# Patient Record
Sex: Female | Born: 1950 | Race: Black or African American | Hispanic: No | Smoking: Never smoker
Health system: Southern US, Community
[De-identification: ages and names within clinical notes are randomized; demographics above are authoritative.]

## PROBLEM LIST (undated history)

## (undated) DIAGNOSIS — C801 Malignant (primary) neoplasm, unspecified: Secondary | ICD-10-CM

## (undated) DIAGNOSIS — J45909 Unspecified asthma, uncomplicated: Secondary | ICD-10-CM

## (undated) DIAGNOSIS — I10 Essential (primary) hypertension: Secondary | ICD-10-CM

## (undated) HISTORY — DX: Essential (primary) hypertension: I10

## (undated) HISTORY — DX: Malignant (primary) neoplasm, unspecified: C80.1

## (undated) HISTORY — DX: Unspecified asthma, uncomplicated: J45.909

## (undated) HISTORY — PX: BREAST SURGERY: SHX581

## (undated) HISTORY — PX: TUBAL LIGATION: SHX77

---

## 2004-03-02 ENCOUNTER — Emergency Department (HOSPITAL_COMMUNITY): Admission: EM | Admit: 2004-03-02 | Discharge: 2004-03-02 | Payer: Self-pay | Admitting: Emergency Medicine

## 2007-05-27 ENCOUNTER — Emergency Department (HOSPITAL_COMMUNITY): Admission: EM | Admit: 2007-05-27 | Discharge: 2007-05-27 | Payer: Self-pay | Admitting: Emergency Medicine

## 2011-04-05 LAB — POCT URINALYSIS DIP (DEVICE)
Bilirubin Urine: NEGATIVE
Glucose, UA: NEGATIVE
Ketones, ur: NEGATIVE
Operator id: 235561
Specific Gravity, Urine: 1.025
Urobilinogen, UA: 0.2

## 2016-07-12 ENCOUNTER — Ambulatory Visit (INDEPENDENT_AMBULATORY_CARE_PROVIDER_SITE_OTHER): Payer: Medicare Other

## 2016-07-12 ENCOUNTER — Ambulatory Visit (INDEPENDENT_AMBULATORY_CARE_PROVIDER_SITE_OTHER): Payer: Medicare Other | Admitting: Physician Assistant

## 2016-07-12 VITALS — BP 126/80 | HR 64 | Temp 97.8°F | Resp 18 | Ht 63.0 in | Wt 184.0 lb

## 2016-07-12 DIAGNOSIS — M79675 Pain in left toe(s): Secondary | ICD-10-CM

## 2016-07-12 DIAGNOSIS — Z23 Encounter for immunization: Secondary | ICD-10-CM | POA: Diagnosis not present

## 2016-07-12 NOTE — Progress Notes (Signed)
Madeline Miles  MRN: [id number] DOB: [birth year]  PCP: No primary care provider on file.  Subjective:  Pt is a 66 year old female who presents to clinic for pain in her left big toe x two days.  Maintenance workers changed the bulbs in her apartment yesterday and must have broken a bulb in her bathroom. She did not feel immediate pain while she was in her bathroom, but later that day when she was out running errands. She cannot see glass in her toe, but she did find glass shards on her bathroom floor. She feels pain in her big toe. Difficulty sleeping last night as she felt pain when the sheet rubbed against her toe.  She is not current on her tetanus shot.  Denies fever, chills, toe swelling, drainage, numbness, tingling, warmth, bleeding.   Review of Systems  Constitutional: Negative for chills, diaphoresis, fatigue and fever.  Respiratory: Negative for cough, chest tightness, shortness of breath and wheezing.   Cardiovascular: Negative for chest pain and palpitations.  Gastrointestinal: Negative for abdominal pain, diarrhea, nausea and vomiting.  Musculoskeletal: Positive for arthralgias and gait problem.  Skin: Negative for color change and wound.  Neurological: Negative for weakness, light-headedness and headaches.    There are no active problems to display for this patient.   No current outpatient prescriptions on file prior to visit.   No current facility-administered medications on file prior to visit.     No Known Allergies   Objective:  BP 126/80 (BP Location: Right Arm, Patient Position: Sitting, Cuff Size: Small)   Pulse 64   Temp 97.8 F (36.6 C) (Oral)   Resp 18   Ht 5\' 3"  (1.6 m)   Wt 184 lb (83.5 kg)   SpO2 97%   BMI 32.59 kg/m   Physical Exam  Constitutional: She is oriented to person, place, and time and well-developed, well-nourished, and in no distress. No distress.  Cardiovascular: Normal rate, regular rhythm and normal heart sounds.     Pulmonary/Chest: Effort normal. No respiratory distress.  Musculoskeletal:       Feet:  Neurological: She is alert and oriented to person, place, and time. GCS score is 15.  Skin: Skin is warm and dry.  Psychiatric: Mood, memory, affect and judgment normal.  Vitals reviewed.   Dg Toe Great Left  Result Date: 07/12/2016 CLINICAL DATA:  66 year old female thinks she stepped on broken glass. Tenderness to palpation (not otherwise specified at the time of this report). Initial encounter. EXAM: LEFT GREAT TOE COMPARISON:  None. FINDINGS: Three views of the left foot centered at the first ray and great toe. No radiopaque foreign body identified. No subcutaneous gas identified. Visible osseous structures appear intact. Mild for age first MTP joint degeneration. Degenerative spurring and mild fragmentation about the dorsal calcaneus. IMPRESSION: 1. No radiopaque foreign body identified; NOTE that not all glass is radiopaque. No subcutaneous gas identified. 2.  No acute osseous abnormality identified. Electronically Signed   By: Genevie Ann M.D.   On: 07/12/2016 14:10    Assessment and Plan :  1. Pain of toe of left foot - DG Toe Great Left; Future - Unable to determine whether glass is present in the foot with visual inspection and x-ray. Advised pt to use warm water soaks at home several times a day, wear footwear that is not tight around toes. Infection precautions discussed. RTC if symptoms worsen. She understands and agrees.   2. Need for diphtheria-tetanus-pertussis (Tdap) vaccine - Tdap vaccine greater than  or equal to 7yo IM    Mercer Pod, PA-C  [facility] 07/12/2016 1:35 PM

## 2016-07-12 NOTE — Patient Instructions (Addendum)
No glass was seen on your xray.  Please come back if you notice redness, drainage or swelling, or if you develop a fever and/or chills.  Use warm water soaks for 15-20 minutes at a time several times a day.   Thank you for coming in today. I hope you feel we met your needs.  Feel free to call [facility] if you have any questions or further requests.  Please consider signing up for MyChart if you do not already have it, as this is a great way to communicate with me.  Best,  Whitney McVey, PA-C   IF you received an x-ray today, you will receive an invoice from [facility]. Please contact [facility] at [phone] with questions or concerns regarding your invoice.   IF you received labwork today, you will receive an invoice from [place]. Please contact [facility] at [phone] with questions or concerns regarding your invoice.   Our billing staff will not be able to assist you with questions regarding bills from these companies.  You will be contacted with the lab results as soon as they are available. The fastest way to get your results is to activate your My Chart account. Instructions are located on the last page of this paperwork. If you have not heard from [place] regarding the results in 2 weeks, please contact this office.

## 2016-12-05 LAB — PULMONARY FUNCTION TEST

## 2016-12-31 ENCOUNTER — Ambulatory Visit (INDEPENDENT_AMBULATORY_CARE_PROVIDER_SITE_OTHER): Payer: Medicare Other | Admitting: Physician Assistant

## 2016-12-31 ENCOUNTER — Encounter: Payer: Self-pay | Admitting: Physician Assistant

## 2016-12-31 ENCOUNTER — Ambulatory Visit (INDEPENDENT_AMBULATORY_CARE_PROVIDER_SITE_OTHER): Payer: Medicare Other

## 2016-12-31 VITALS — BP 149/85 | HR 60 | Temp 98.0°F | Resp 16 | Ht 63.0 in | Wt 185.0 lb

## 2016-12-31 DIAGNOSIS — M546 Pain in thoracic spine: Secondary | ICD-10-CM

## 2016-12-31 MED ORDER — CYCLOBENZAPRINE HCL 10 MG PO TABS: 5.0000 mg | ORAL_TABLET | Freq: Three times a day (TID) | ORAL | 0 refills | Status: DC | PRN

## 2016-12-31 NOTE — Progress Notes (Signed)
PRIMARY CARE AT [facility] [address], [place] [address] [phone]  Date:  12/31/2016   Name:  Madeline Miles   DOB:  [birth year]   MRN:  [id number]  PCP:  Patient, No Pcp Per    History of Present Illness:  Madeline Miles is a 66 y.o. female patient who presents to PCP with  Chief Complaint  Patient presents with  . Back Pain     1 month ago, with progressively worsened mid-low back pain.  May hurt with deep inspiration, and when she is laying back.  Feels like she "pulled something".  No numbness or tingling.  She has no hx of trauma.  She woke up in the morning with this sensation.   She had a deep tissue massage, which seemed to worsen symptoms.   Wt Readings from Last 3 Encounters:  12/31/16 185 lb (83.9 kg)  07/12/16 184 lb (83.5 kg)     There are no active problems to display for this patient.   Past Medical History:  Diagnosis Date  . Asthma   . Cancer ([place])   . Hypertension     Past Surgical History:  Procedure Laterality Date  . BREAST SURGERY    . CESAREAN SECTION    . TUBAL LIGATION      Social History  Substance Use Topics  . Smoking status: Never Smoker  . Smokeless tobacco: Never Used  . Alcohol use No    Family History  Problem Relation Age of Onset  . Heart disease Mother   . Hyperlipidemia Mother   . Hypertension Mother   . Hypertension Father   . Stroke Father   . Diabetes Sister   . Hypertension Sister   . Hyperlipidemia Sister   . Heart disease Brother   . Heart disease Brother   . Hypertension Brother   . Heart disease Sister   . Hypertension Sister     No Known Allergies  Medication list has been reviewed and updated.  Current Outpatient Prescriptions on File Prior to Visit  Medication Sig Dispense Refill  . amLODipine (NORVASC) 10 MG tablet Take 10 mg by mouth daily.    . multivitamin-iron-minerals-folic acid (CENTRUM) chewable tablet Chew 1 tablet by mouth daily.     No current facility-administered medications  on file prior to visit.     ROS ROS otherwise unremarkable unless listed above.  Physical Examination: BP (!) 149/85   Pulse 60   Temp 98 F (36.7 C) (Oral)   Resp 16   Ht 5\' 3"  (1.6 m)   Wt 185 lb (83.9 kg)   SpO2 99%   BMI 32.77 kg/m  Ideal Body Weight: Weight in (lb) to have BMI = 25: 140.8  Physical Exam  Constitutional: She is oriented to person, place, and time. She appears well-developed and well-nourished. No distress.  HENT:  Head: Normocephalic and atraumatic.  Right Ear: External ear normal.  Left Ear: External ear normal.  Eyes: Conjunctivae and EOM are normal. Pupils are equal, round, and reactive to light.  Cardiovascular: Normal rate.   Pulmonary/Chest: Effort normal. No respiratory distress.  Musculoskeletal:       Thoracic back: She exhibits tenderness and bony tenderness. She exhibits normal range of motion.       Lumbar back: She exhibits normal range of motion, no tenderness and no bony tenderness.  Neurological: She is alert and oriented to person, place, and time.  Skin: She is not diaphoretic.  Psychiatric: She has a normal mood and affect.  Her behavior is normal.     Assessment and Plan: Kitana Gage is a 66 y.o. female who is here today for cc of back pain. --likely thoracic strain --advising tylenol and muscle relaxant.   --given stretches and advised icing regimen.   Acute right-sided thoracic back pain - Plan: DG Thoracic Spine 2 View  Ivar Drape, PA-C [facility] 7/9/20187:58 AM

## 2016-12-31 NOTE — Patient Instructions (Addendum)
Please ice the back the three times per day for 15 minutes. Please use the tylenol. We will also do the muscle relaxant.  Please be careful with moving around at night.  This can cause sedation.   Please perform three stretches, three times per day.  Directly after course of stretching, please ice the back.    Mid-Back Strain Rehab Ask your health care provider which exercises are safe for you. Do exercises exactly as told by your health care provider and adjust them as directed. It is normal to feel mild stretching, pulling, tightness, or discomfort as you do these exercises, but you should stop right away if you feel sudden pain or your pain gets worse. Do not begin these exercises until told by your health care provider. Stretching and range of motion exercises This exercise warms up your muscles and joints and improves the movement and flexibility of your back and shoulders. This exercise also help to relieve pain. Exercise A: Chest and spine stretch  1. Lie down on your back on a firm surface. 2. Roll a towel or a small blanket so it is about 4 inches (10 cm) in diameter. 3. Put the towel lengthwise under the middle of your back so it is under your spine, but not under your shoulder blades. 4. To increase the stretch, you may put your hands behind your head and let your elbows fall to your sides. 5. Hold for __________ seconds. Repeat exercise __________ times. Complete this exercise __________ times a day. Strengthening exercises These exercises build strength and endurance in your back and your shoulder blade muscles. Endurance is the ability to use your muscles for a long time, even after they get tired. Exercise B: Alternating arm and leg raises  1. Get on your hands and knees on a firm surface. If you are on a hard floor, you may want to use padding to cushion your knees, such as an exercise mat. 2. Line up your arms and legs. Your hands should be below your shoulders, and your knees  should be below your hips. 3. Lift your left leg behind you. At the same time, raise your right arm and straighten it in front of you. ? Do not lift your leg higher than your hip. ? Do not lift your arm higher than your shoulder. ? Keep your abdominal and back muscles tight. ? Keep your hips facing the ground. ? Do not arch your back. ? Keep your balance carefully, and do not hold your breath. 4. Hold for __________ seconds. 5. Slowly return to the starting position and repeat with your right leg and your left arm. Repeat __________ times. Complete this exercise __________ times a day. Exercise C: Straight arm rows ( shoulder extension) 1. Stand with your feet shoulder width apart. 2. Secure an exercise band to a stable object in front of you so the band is at or above shoulder height. 3. Hold one end of the exercise band in each hand. 4. Straighten your elbows and lift your hands up to shoulder height. 5. Step back, away from the secured end of the exercise band, until the band stretches. 6. Squeeze your shoulder blades together and pull your hands down to the sides of your thighs. Stop when your hands are straight down by your sides. Do not let your hands go behind your body. 7. Hold for __________ seconds. 8. Slowly return to the starting position. Repeat __________ times. Complete this exercise __________ times a day. Exercise D:  Shoulder external rotation, prone 1. Lie on your abdomen on a firm bed so your left / right forearm hangs over the edge of the bed and your upper arm is on the bed, straight out from your body. ? Your elbow should be bent. ? Your palm should be facing your feet. 2. If instructed, hold a __________ weight in your hand. 3. Squeeze your shoulder blade toward the middle of your back. Do not let your shoulder lift toward your ear. 4. Keep your elbow bent in an "L" shape (90 degrees) while you slowly move your forearm up toward the ceiling. Move your forearm up to  the height of the bed, toward your head. ? Your upper arm should not move. ? At the top of the movement, your palm should face the floor. 5. Hold for __________ seconds. 6. Slowly return to the starting position and relax your muscles. Repeat __________ times. Complete this exercise __________ times a day. Exercise E: Scapular retraction and external rotation, rowing  1. Sit in a stable chair without armrests, or stand. 2. Secure an exercise band to a stable object in front of you so it is at shoulder height. 3. Hold one end of the exercise band in each hand. 4. Bring your arms out straight in front of you. 5. Step back, away from the secured end of the exercise band, until the band stretches. 6. Pull the band backward. As you do this, bend your elbows and squeeze your shoulder blades together, but avoid letting the rest of your body move. Do not let your shoulders lift up toward your ears. 7. Stop when your elbows are at your sides or slightly behind your body. 8. Hold for __________ seconds. 9. Slowly straighten your arms to return to the starting position. Repeat __________ times. Complete this exercise __________ times a day. Posture and body mechanics  Body mechanics refers to the movements and positions of your body while you do your daily activities. Posture is part of body mechanics. Good posture and healthy body mechanics can help to relieve stress in your body's tissues and joints. Good posture means that your spine is in its natural S-curve position (your spine is neutral), your shoulders are pulled back slightly, and your head is not tipped forward. The following are general guidelines for applying improved posture and body mechanics to your everyday activities. Standing   When standing, keep your spine neutral and your feet about hip-width apart. Keep a slight bend in your knees. Your ears, shoulders, and hips should line up.  When you do a task in which you lean forward while  standing in one place for a long time, place one foot up on a stable object that is 2-4 inches (5-10 cm) high, such as a footstool. This helps keep your spine neutral. Sitting   When sitting, keep your spine neutral and keep your feet flat on the floor. Use a footrest, if necessary, and keep your thighs parallel to the floor. Avoid rounding your shoulders, and avoid tilting your head forward.  When working at a desk or a computer, keep your desk at a height where your hands are slightly lower than your elbows. Slide your chair under your desk so you are close enough to maintain good posture.  When working at a computer, place your monitor at a height where you are looking straight ahead and you do not have to tilt your head forward or downward to look at the screen. Resting  When lying  down and resting, avoid positions that are most painful for you.  If you have pain with activities such as sitting, bending, stooping, or squatting (flexion-based activities), lie in a position in which your body does not bend very much. For example, avoid curling up on your side with your arms and knees near your chest (fetal position).  If you have pain with activities such as standing for a long time or reaching with your arms (extension-based activities), lie with your spine in a neutral position and bend your knees slightly. Try the following positions:  Lying on your side with a pillow between your knees.  Lying on your back with a pillow under your knees.  Lifting   When lifting objects, keep your feet at least shoulder-width apart and tighten your abdominal muscles.  Bend your knees and hips and keep your spine neutral. It is important to lift using the strength of your legs, not your back. Do not lock your knees straight out.  Always ask for help to lift heavy or awkward objects. This information is not intended to replace advice given to you by your health care provider. Make sure you discuss any  questions you have with your health care provider. Document Released: 06/13/2005 Document Revised: 02/18/2016 Document Reviewed: 03/25/2015 Elsevier Interactive Patient Education  2018 [employer].     IF you received an x-ray today, you will receive an invoice from [facility]. Please contact [facility] at [phone] with questions or concerns regarding your invoice.   IF you received labwork today, you will receive an invoice from [place]. Please contact [facility] at [phone] with questions or concerns regarding your invoice.   Our billing staff will not be able to assist you with questions regarding bills from these companies.  You will be contacted with the lab results as soon as they are available. The fastest way to get your results is to activate your My Chart account. Instructions are located on the last page of this paperwork. If you have not heard from [place] regarding the results in 2 weeks, please contact this office.

## 2017-01-02 ENCOUNTER — Telehealth: Payer: Self-pay

## 2017-01-02 NOTE — Telephone Encounter (Signed)
PA started Cyclobenzaprine HCL Awaiting Response   Your information has been submitted to [place] [employer] Part [employer]. [employer] will review the request and will issue a decision, typically within 1-3 days from your submission. You can check the updated outcome later by reopening this request. If [employer] has not responded in 1-3 days or if you have any questions about your ePA request, please contact [place] [employer] Part D at [phone]. If you think there may be a problem with your PA request, use our live chat feature at the bottom right.

## 2017-01-03 NOTE — Telephone Encounter (Signed)
PA Cyclobenzaprine Approved

## 2017-09-19 LAB — PULMONARY FUNCTION TEST

## 2017-09-27 ENCOUNTER — Encounter: Payer: Self-pay | Admitting: Physician Assistant

## 2017-11-28 LAB — PULMONARY FUNCTION TEST

## 2018-02-15 IMAGING — DX DG TOE GREAT 2+V*L*
3 series · 3 of 3 positions shown · non-contrast
Comparison: None.

CLINICAL DATA: 65-year-old female thinks she stepped on broken
glass. Tenderness to palpation (not otherwise specified at the time
of this report). Initial encounter.

EXAM:
LEFT GREAT TOE

[toe ap]
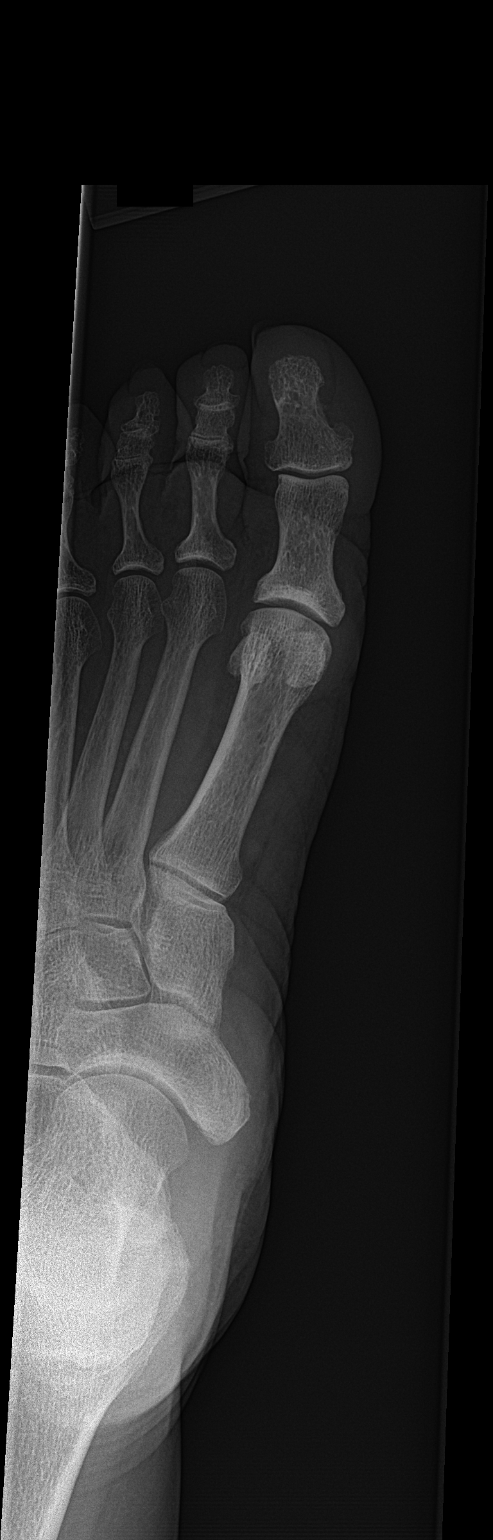

[toe obl]
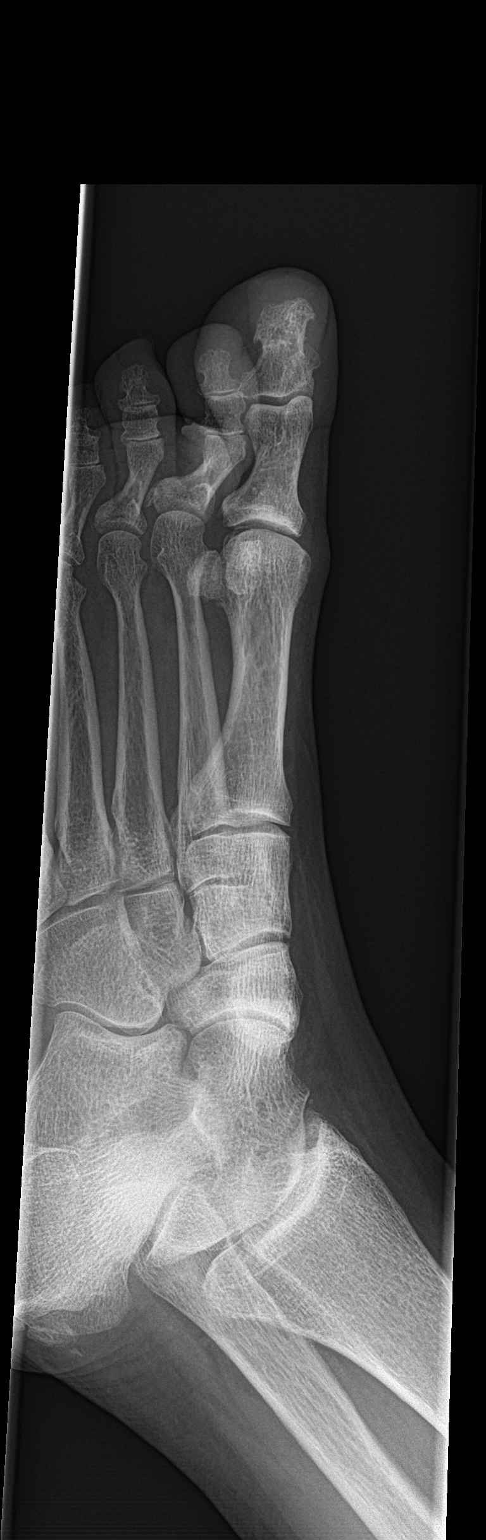

[toe lat]
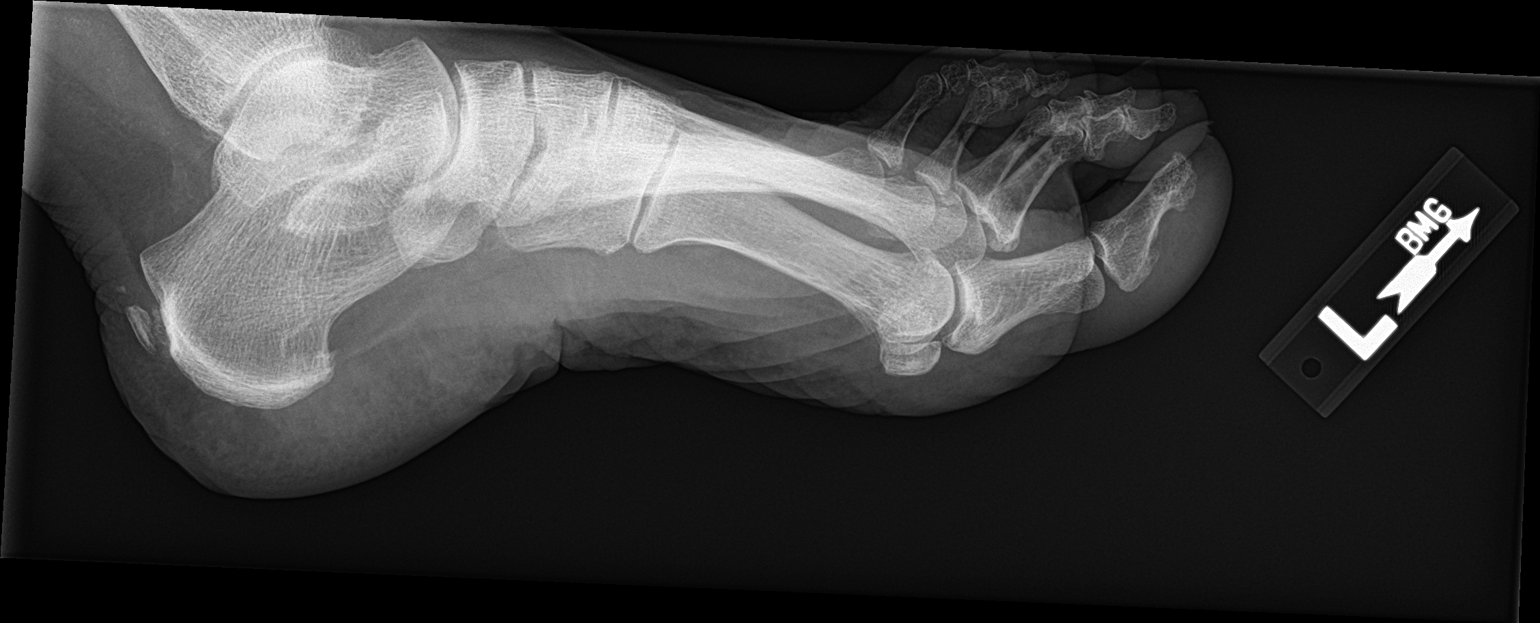

[3 of 3 positions shown; findings below may reference images not displayed]

FINDINGS: Three views of the left foot centered at the first ray and great
toe. No radiopaque foreign body identified. No subcutaneous gas
identified. Visible osseous structures appear intact. Mild for age
first MTP joint degeneration. Degenerative spurring and mild
fragmentation about the dorsal calcaneus.
IMPRESSION: 1. No radiopaque foreign body identified; NOTE that not all glass is
radiopaque. No subcutaneous gas identified.
2.  No acute osseous abnormality identified.

## 2018-03-28 DIAGNOSIS — I10 Essential (primary) hypertension: Secondary | ICD-10-CM | POA: Insufficient documentation

## 2018-03-28 DIAGNOSIS — Z853 Personal history of malignant neoplasm of breast: Secondary | ICD-10-CM | POA: Insufficient documentation

## 2018-03-28 DIAGNOSIS — J3089 Other allergic rhinitis: Secondary | ICD-10-CM | POA: Insufficient documentation

## 2018-08-06 IMAGING — DX DG THORACIC SPINE 2V
2 series · 2 of 2 positions shown · non-contrast
Comparison: None.

CLINICAL DATA: Thoracic back pain for 1 month.

EXAM:
THORACIC SPINE 2 VIEWS

[t-spine ap]
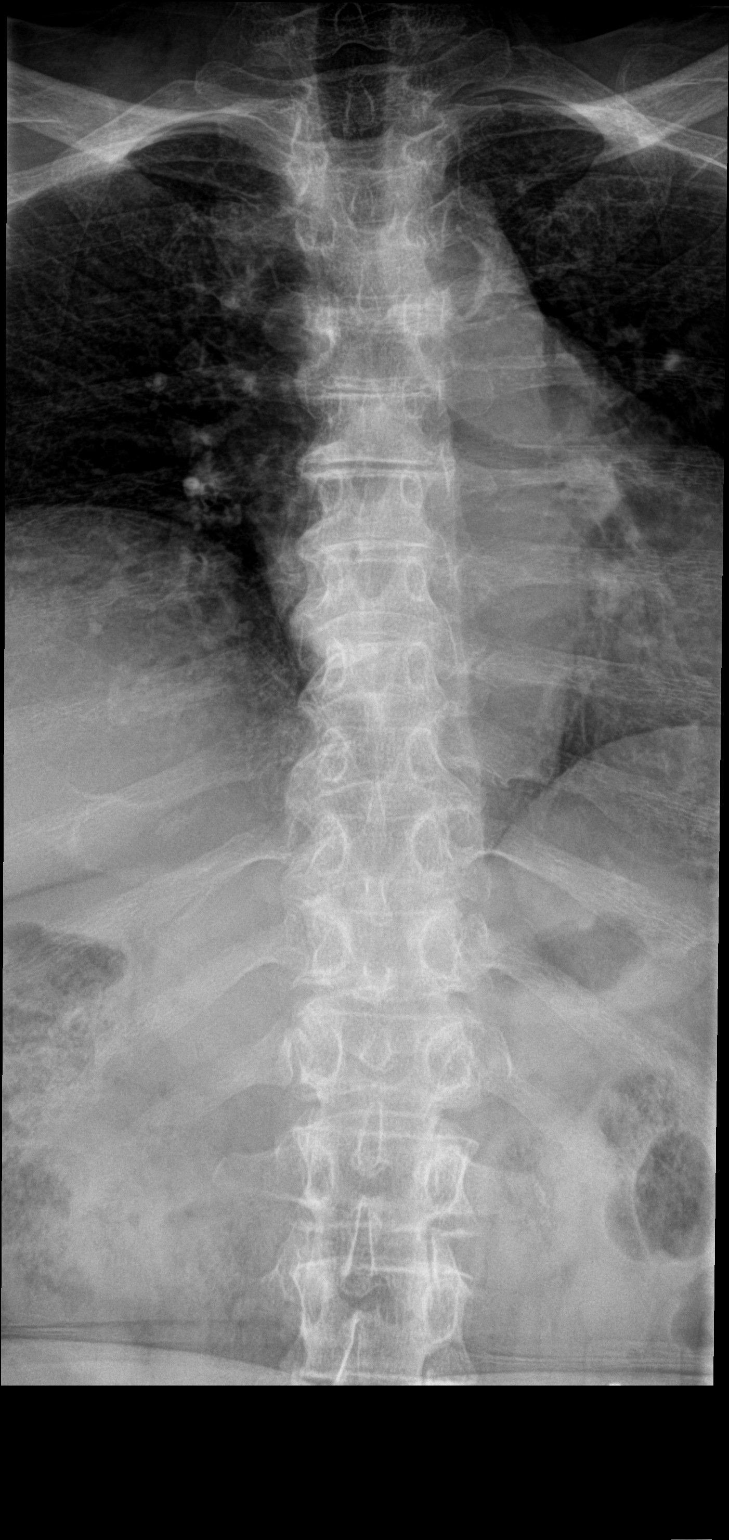

[t-spine lat]
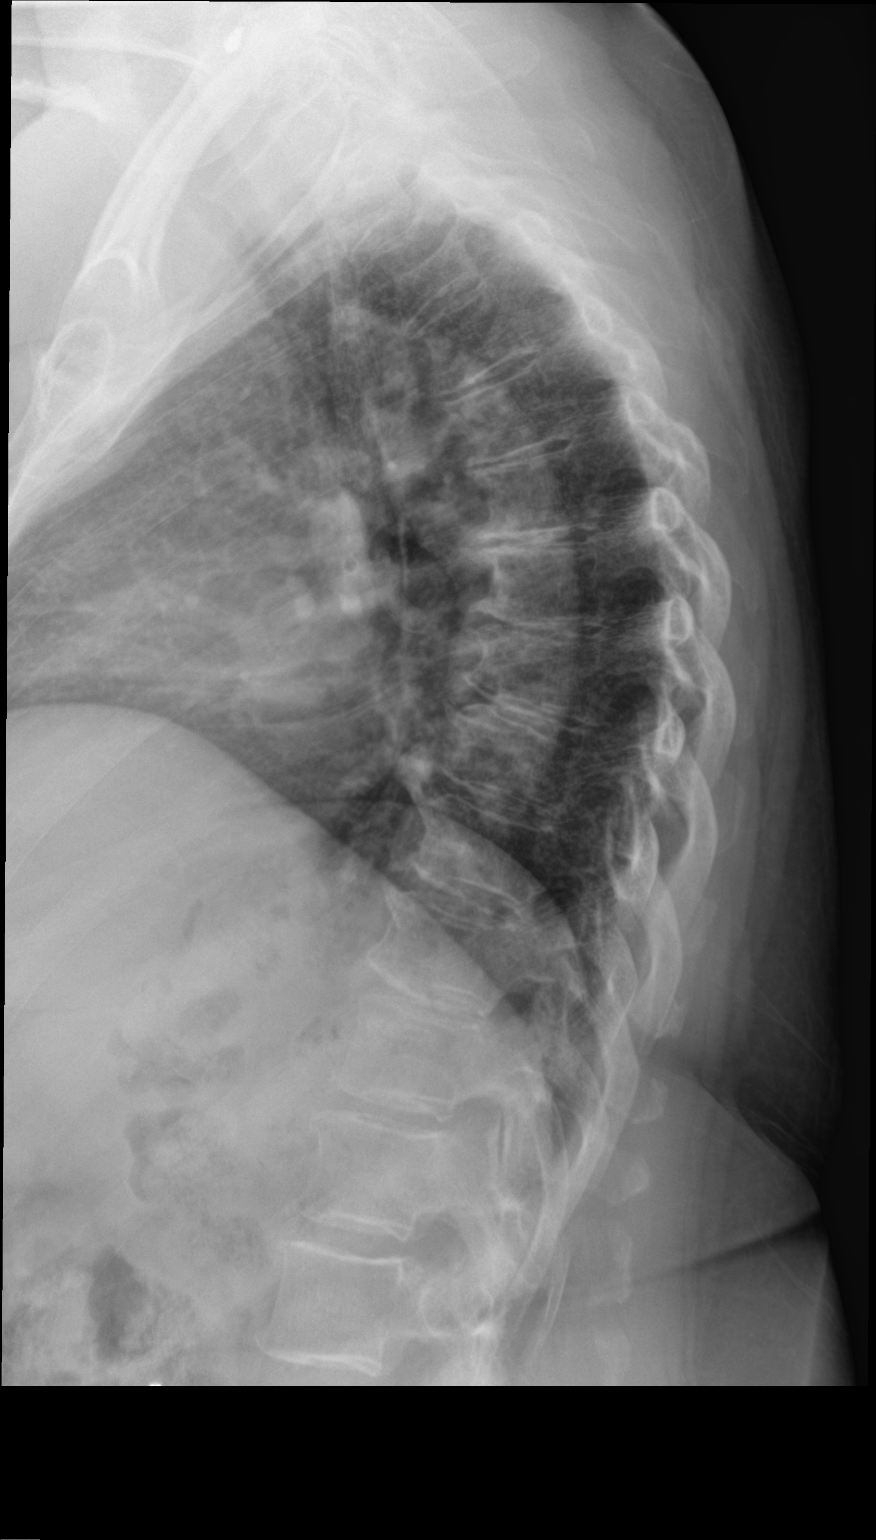

[2 of 2 positions shown; findings below may reference images not displayed]

FINDINGS: Multilevel degenerative disc disease with anterior osteophytes. No
fracture or traumatic malalignment.
IMPRESSION: Degenerative changes with no fracture or traumatic malalignment.

## 2019-02-21 DIAGNOSIS — N840 Polyp of corpus uteri: Secondary | ICD-10-CM | POA: Insufficient documentation

## 2019-11-12 ENCOUNTER — Encounter: Payer: Self-pay | Admitting: Primary Care

## 2019-11-12 LAB — PULMONARY FUNCTION TEST

## 2020-04-21 ENCOUNTER — Ambulatory Visit (INDEPENDENT_AMBULATORY_CARE_PROVIDER_SITE_OTHER): Payer: No Typology Code available for payment source | Admitting: Nurse Practitioner

## 2020-04-21 VITALS — BP 128/88 | HR 70 | Temp 97.8°F | Ht 63.0 in | Wt 181.0 lb

## 2020-04-21 DIAGNOSIS — R0602 Shortness of breath: Secondary | ICD-10-CM

## 2020-04-21 DIAGNOSIS — R5381 Other malaise: Secondary | ICD-10-CM | POA: Diagnosis not present

## 2020-04-21 DIAGNOSIS — Z8616 Personal history of COVID-19: Secondary | ICD-10-CM | POA: Diagnosis not present

## 2020-04-21 DIAGNOSIS — Z8709 Personal history of other diseases of the respiratory system: Secondary | ICD-10-CM | POA: Diagnosis not present

## 2020-04-21 NOTE — Patient Instructions (Addendum)
History of Covid 19 Shortness of breath History of Asthma Deconditioning:   Stay well hydrated  Stay active  Deep breathing exercises  May start vitamin C 2,000 mg daily, vitamin D3 2,000 IU daily, Zinc 220 mg daily  Will order chest x ray  Will place referral to pulmonary  Will place referral to PT     Follow up:  Follow up in 1 month or sooner if needed

## 2020-04-21 NOTE — Progress Notes (Signed)
@Patient  ID: Madeline Miles, female    DOB: 1951/04/23, 69 y.o.   MRN: [id number]  Chief Complaint  Patient presents with  . New Patient (Initial Visit)    COVID 07/2019. gets "winded" quickly    Referring provider: No ref. provider found   69 year old female with history of hypertension, asthma, breast cancer.  HPI  Patient presents today for post COVID care clinic visit.  She was diagnosed with Covid in February 2021.  She states that at that time she was sick for about a month.  She complains of continued shortness of breath with exertion.  Patient does have a history of asthma.  She does currently use albuterol, Qvar, and Singulair.  Patient has recently moved here from [place] and does not have an asthma specialist here.  Patient states that she is trying to stay active. Denies f/c/s, n/v/d, hemoptysis, PND, chest pain or edema.   Note: Patient was walked in office today and O2 sats remained above 96% for entire walk heart rate was stable.  Allergies  Allergen Reactions  . Pseudoephedrine Hcl Other (See Comments)    Immunization History  Administered Date(s) Administered  . Influenza-Unspecified 04/27/2016  . Tdap 07/12/2016    Past Medical History:  Diagnosis Date  . Asthma   . Cancer ([place])   . Hypertension     Tobacco History: Social History   Tobacco Use  Smoking Status Never Smoker  Smokeless Tobacco Never Used   Counseling given: Yes   Outpatient Encounter Medications as of 04/21/2020  Medication Sig  . albuterol (PROAIR HFA) 108 (90 Base) MCG/ACT inhaler ProAir HFA 90 mcg/actuation aerosol inhaler  Inhale 2 puffs every 4 hours by inhalation route as needed.  Marland Kitchen albuterol (PROVENTIL) (2.5 MG/3ML) 0.083% nebulizer solution albuterol sulfate 2.5 mg/3 mL (0.083 %) solution for nebulization  INHALE 3 MILLILITER BY NEBULIZATION ROUTE 4 TIMES EVERY DAY  . amLODipine (NORVASC) 10 MG tablet Take 10 mg by mouth daily.  . beclomethasone (QVAR) 80 MCG/ACT  inhaler Qvar 80 mcg/actuation Metered Aerosol oral inhaler  INHALE 1 PUFF BY INHALATION ROUTE 2 TIMES EVERY DAY  . Calcium Carbonate-Vit D-Min (CALTRATE 600+D PLUS PO) Caltrate  WITH VITAMIN D  . Cetirizine HCl (ZYRTEC ALLERGY) 10 MG CAPS Zyrtec 10 mg capsule  1 TAB DAILY  . Cholecalciferol (EQL VITAMIN D3) 2000 units CAPS Take 1 capsule by mouth daily.  . meclizine (ANTIVERT) 25 MG tablet meclizine 25 mg tablet  Take 1 tablet 3 times a day by oral route as needed.  . montelukast (SINGULAIR) 10 MG tablet montelukast 10 mg tablet  TAKE 1 TABLET BY ORAL ROUTE  EVERY DAY IN THE EVENING  . multivitamin-iron-minerals-folic acid (CENTRUM) chewable tablet Chew 1 tablet by mouth daily.  . celecoxib (CELEBREX) 200 MG capsule Celebrex 200 mg capsule  CELEBREX 200MG , 1 CAPSULE DAILY # 30, 08/05/2011, REF. X5. INACTIVE (Patient not taking: Reported on 04/21/2020)  . cyclobenzaprine (FLEXERIL) 10 MG tablet Take 0.5-1 tablets (5-10 mg total) by mouth 3 (three) times daily as needed for muscle spasms. (Patient not taking: Reported on 04/21/2020)  . IRON PO Take 1 capsule by mouth. (Patient not taking: Reported on 04/21/2020)   No facility-administered encounter medications on file as of 04/21/2020.     Review of Systems  Review of Systems  Constitutional: Positive for fatigue. Negative for fever.  HENT: Negative.   Respiratory: Positive for shortness of breath. Negative for cough.   Cardiovascular: Negative.  Negative for chest pain, palpitations and leg  swelling.  Gastrointestinal: Negative.   Allergic/Immunologic: Negative.   Neurological: Negative.   Psychiatric/Behavioral: Negative.        Physical Exam  BP 128/88 (BP Location: Left Arm)   Pulse 70   Temp 97.8 F (36.6 C)   Ht 5\' 3"  (1.6 m)   Wt 181 lb 0.1 oz (82.1 kg)   SpO2 98%   BMI 32.06 kg/m   Wt Readings from Last 5 Encounters:  04/21/20 181 lb 0.1 oz (82.1 kg)  12/31/16 185 lb (83.9 kg)  07/12/16 184 lb (83.5 kg)      Physical Exam Vitals and nursing note reviewed.  Constitutional:      General: She is not in acute distress.    Appearance: She is well-developed.  Cardiovascular:     Rate and Rhythm: Normal rate and regular rhythm.  Pulmonary:     Effort: Pulmonary effort is normal.     Breath sounds: Normal breath sounds.  Neurological:     Mental Status: She is alert and oriented to person, place, and time.  Psychiatric:        Mood and Affect: Mood normal.        Behavior: Behavior normal.       Assessment & Plan:   History of COVID-19 Shortness of breath History of Asthma Deconditioning:   Stay well hydrated  Stay active  Deep breathing exercises  May start vitamin C 2,000 mg daily, vitamin D3 2,000 IU daily, Zinc 220 mg daily  Will order chest x ray  Will place referral to pulmonary  Will place referral to PT     Follow up:  Follow up in 1 month or sooner if needed      Fenton Foy, NP 04/22/2020

## 2020-04-22 ENCOUNTER — Ambulatory Visit
Admission: RE | Admit: 2020-04-22 | Discharge: 2020-04-22 | Disposition: A | Discharge status: Home / Self Care | Payer: Medicare Other | Source: Ambulatory Visit | Attending: Nurse Practitioner | Admitting: Nurse Practitioner

## 2020-04-22 DIAGNOSIS — Z8709 Personal history of other diseases of the respiratory system: Secondary | ICD-10-CM | POA: Insufficient documentation

## 2020-04-22 DIAGNOSIS — R5381 Other malaise: Secondary | ICD-10-CM | POA: Insufficient documentation

## 2020-04-22 DIAGNOSIS — Z8616 Personal history of COVID-19: Secondary | ICD-10-CM | POA: Insufficient documentation

## 2020-04-22 DIAGNOSIS — R0602 Shortness of breath: Secondary | ICD-10-CM | POA: Insufficient documentation

## 2020-04-22 NOTE — Assessment & Plan Note (Signed)
Shortness of breath History of Asthma Deconditioning:   Stay well hydrated  Stay active  Deep breathing exercises  May start vitamin C 2,000 mg daily, vitamin D3 2,000 IU daily, Zinc 220 mg daily  Will order chest x ray  Will place referral to pulmonary  Will place referral to PT     Follow up:  Follow up in 1 month or sooner if needed

## 2020-04-29 ENCOUNTER — Other Ambulatory Visit: Payer: Self-pay

## 2020-04-29 ENCOUNTER — Encounter: Payer: Self-pay | Admitting: Emergency Medicine

## 2020-04-29 ENCOUNTER — Ambulatory Visit (INDEPENDENT_AMBULATORY_CARE_PROVIDER_SITE_OTHER): Payer: Medicare Other | Admitting: Emergency Medicine

## 2020-04-29 VITALS — BP 142/80 | HR 62 | Temp 97.9°F | Ht 63.0 in | Wt 177.4 lb

## 2020-04-29 DIAGNOSIS — Z8616 Personal history of COVID-19: Secondary | ICD-10-CM

## 2020-04-29 DIAGNOSIS — Z8709 Personal history of other diseases of the respiratory system: Secondary | ICD-10-CM | POA: Diagnosis not present

## 2020-04-29 NOTE — Patient Instructions (Signed)
Restart your QVAR 2 puffs twice a day.  Rinse and gargle after using We will not restart your Singulair at this time.  We may decide to do so going forward. Keep your albuterol available to use 2 puffs or 1 nebulizer treatment if needed for shortness of breath, chest tightness, wheezing. Continue your Allegra every day We will perform repeat pulmonary function testing in next office visit here We will work on obtaining-year-old pulmonary records and test results from [place] Follow with Dr. Lamonte Sakai next available with full pulmonary function testing on the same day.

## 2020-04-29 NOTE — Assessment & Plan Note (Signed)
More symptomatic when she had COVID-19, some residual dyspnea and wheeze present even after recovery from the virus.  Certainly could be related to the virus itself and progression of her obstructive lung disease but note that she is not currently on maintenance medication.  She was temporarily on Symbicort but never went back on the Qvar after she recovered from Covid.  I think we should put her back on maintenance medicine and see if we can achieve better control.  We will work on performing repeat pulmonary function testing, obtain her old PFT for comparison.  Chest x-ray 04/24/2020 was reassuring, no evidence of interstitial disease post Covid.  Restart your QVAR 2 puffs twice a day.  Rinse and gargle after using We will not restart your Singulair at this time.  We may decide to do so going forward. Keep your albuterol available to use 2 puffs or 1 nebulizer treatment if needed for shortness of breath, chest tightness, wheezing. Continue your Allegra every day We will perform repeat pulmonary function testing in next office visit here We will work on obtaining-year-old pulmonary records and test results from [place] Follow with Dr. Lamonte Sakai next available with full pulmonary function testing on the same day.

## 2020-04-29 NOTE — Progress Notes (Signed)
Subjective:    Patient ID: Madeline Miles, female    DOB: January 22, 1951, 69 y.o.   MRN: [id number]  HPI 69 year old never smoker with a history of hypertension, allergic rhinitis, angioedema, asthma that was originally evaluated in [place].  She was diagnosed with COVID-19 pneumonia in February 2021.  She reports that she had throat irritation, URI sx, fevers, developed cough and phlegm, progressive SOB. Received prednisone, did not get the monoclonal Ab. After about a month was treated with abx and did improve some. Was temporarily changed QVAR to Symbicort, now back to the QVAR. She is trying to get back to walking, but is not able to do as much as pre-COVID.  She is having more wheeze, cough than her prior baseline. Her last PFT were with her [place] pulmonologist  She is now [place] vaccinated.   Currently uses allegra, is off singulair. She is using QVAR, but prn.   Chest x-ray 04/24/2020 reviewed by me, shows no significant infiltrates.   Review of Systems As per HPI  Past Medical History:  Diagnosis Date  . Asthma   . Cancer ([place])   . Hypertension      Family History  Problem Relation Age of Onset  . Heart disease Mother   . Hyperlipidemia Mother   . Hypertension Mother   . Hypertension Father   . Stroke Father   . Diabetes Sister   . Hypertension Sister   . Hyperlipidemia Sister   . Heart disease Brother   . Heart disease Brother   . Hypertension Brother   . Heart disease Sister   . Hypertension Sister      Social History   Socioeconomic History  . Marital status: Widowed    Spouse name: Not on file  . Number of children: Not on file  . Years of education: Not on file  . Highest education level: Not on file  Occupational History  . Not on file  Tobacco Use  . Smoking status: Never Smoker  . Smokeless tobacco: Never Used  Vaping Use  . Vaping Use: Never used  Substance and Sexual Activity  . Alcohol use: No  . Drug use: No  . Sexual activity: Never   Other Topics Concern  . Not on file  Social History Narrative  . Not on file   Social Determinants of Health   Financial Resource Strain:   . Difficulty of Paying Living Expenses: Not on file  Food Insecurity:   . Worried About Charity fundraiser in the Last Year: Not on file  . Ran Out of Food in the Last Year: Not on file  Transportation Needs:   . Lack of Transportation (Medical): Not on file  . Lack of Transportation (Non-Medical): Not on file  Physical Activity:   . Days of Exercise per Week: Not on file  . Minutes of Exercise per Session: Not on file  Stress:   . Feeling of Stress : Not on file  Social Connections:   . Frequency of Communication with Friends and Family: Not on file  . Frequency of Social Gatherings with Friends and Family: Not on file  . Attends Religious Services: Not on file  . Active Member of Clubs or Organizations: Not on file  . Attends Archivist Meetings: Not on file  . Marital Status: Not on file  Intimate Partner Violence:   . Fear of Current or Ex-Partner: Not on file  . Emotionally Abused: Not on file  . Physically Abused: Not  on file  . Sexually Abused: Not on file     Allergies  Allergen Reactions  . Pseudoephedrine Hcl Other (See Comments)     Outpatient Medications Prior to Visit  Medication Sig Dispense Refill  . albuterol (PROAIR HFA) 108 (90 Base) MCG/ACT inhaler ProAir HFA 90 mcg/actuation aerosol inhaler  Inhale 2 puffs every 4 hours by inhalation route as needed.    Marland Kitchen albuterol (PROVENTIL) (2.5 MG/3ML) 0.083% nebulizer solution albuterol sulfate 2.5 mg/3 mL (0.083 %) solution for nebulization  INHALE 3 MILLILITER BY NEBULIZATION ROUTE 4 TIMES EVERY DAY    . amLODipine (NORVASC) 10 MG tablet Take 10 mg by mouth daily.    . beclomethasone (QVAR) 80 MCG/ACT inhaler Qvar 80 mcg/actuation Metered Aerosol oral inhaler  INHALE 1 PUFF BY INHALATION ROUTE 2 TIMES EVERY DAY    . Calcium Carbonate-Vit D-Min (CALTRATE 600+D  PLUS PO) Caltrate  WITH VITAMIN D    . celecoxib (CELEBREX) 200 MG capsule Celebrex 200 mg capsule  CELEBREX 200MG , 1 CAPSULE DAILY # 30, 08/05/2011, REF. X5. INACTIVE    . Cetirizine HCl (ZYRTEC ALLERGY) 10 MG CAPS Zyrtec 10 mg capsule  1 TAB DAILY    . Cholecalciferol (EQL VITAMIN D3) 2000 units CAPS Take 1 capsule by mouth daily.    . cyclobenzaprine (FLEXERIL) 10 MG tablet Take 0.5-1 tablets (5-10 mg total) by mouth 3 (three) times daily as needed for muscle spasms. 30 tablet 0  . IRON PO Take 1 capsule by mouth.     . meclizine (ANTIVERT) 25 MG tablet meclizine 25 mg tablet  Take 1 tablet 3 times a day by oral route as needed.    . montelukast (SINGULAIR) 10 MG tablet montelukast 10 mg tablet  TAKE 1 TABLET BY ORAL ROUTE  EVERY DAY IN THE EVENING    . multivitamin-iron-minerals-folic acid (CENTRUM) chewable tablet Chew 1 tablet by mouth daily.    . budesonide-formoterol (SYMBICORT) 160-4.5 MCG/ACT inhaler Inhale into the lungs.     No facility-administered medications prior to visit.       Objective:   Physical Exam  Vitals:   04/29/20 0955  BP: (!) 142/80  Pulse: 62  Temp: 97.9 F (36.6 C)  SpO2: 99%  Weight: 177 lb 6.4 oz (80.5 kg)  Height: 5\' 3"  (1.6 m)   Gen: Pleasant, well-nourished, in no distress,  normal affect  ENT: No lesions,  mouth clear,  oropharynx clear, no postnasal drip  Neck: No JVD, no stridor  Lungs: No use of accessory muscles, small breaths,  no crackles or wheezing on normal respiration, no wheeze on forced expiration  Cardiovascular: RRR, heart sounds normal, no murmur or gallops, no peripheral edema  Musculoskeletal: No deformities, no cyanosis or clubbing  Neuro: alert, awake, non focal  Skin: Warm, no lesions or rash    Assessment & Plan:  History of asthma More symptomatic when she had COVID-19, some residual dyspnea and wheeze present even after recovery from the virus.  Certainly could be related to the virus itself and progression  of her obstructive lung disease but note that she is not currently on maintenance medication.  She was temporarily on Symbicort but never went back on the Qvar after she recovered from Covid.  I think we should put her back on maintenance medicine and see if we can achieve better control.  We will work on performing repeat pulmonary function testing, obtain her old PFT for comparison.  Chest x-ray 04/24/2020 was reassuring, no evidence of interstitial disease post  Covid.  Restart your QVAR 2 puffs twice a day.  Rinse and gargle after using We will not restart your Singulair at this time.  We may decide to do so going forward. Keep your albuterol available to use 2 puffs or 1 nebulizer treatment if needed for shortness of breath, chest tightness, wheezing. Continue your Allegra every day We will perform repeat pulmonary function testing in next office visit here We will work on obtaining-year-old pulmonary records and test results from [place] Follow with Dr. Lamonte Sakai next available with full pulmonary function testing on the same day.   Baltazar Apo, MD, PhD 04/29/2020, 12:22 PM  Pulmonary and Critical Care [phone] or if no answer [phone]

## 2020-05-06 ENCOUNTER — Other Ambulatory Visit: Payer: Self-pay

## 2020-05-06 ENCOUNTER — Ambulatory Visit: Payer: Medicare Other | Attending: Nurse Practitioner | Admitting: Physical Therapy

## 2020-05-06 ENCOUNTER — Encounter: Payer: Self-pay | Admitting: Physical Therapy

## 2020-05-06 DIAGNOSIS — Z7409 Other reduced mobility: Secondary | ICD-10-CM | POA: Diagnosis present

## 2020-05-06 DIAGNOSIS — R262 Difficulty in walking, not elsewhere classified: Secondary | ICD-10-CM | POA: Diagnosis not present

## 2020-05-06 DIAGNOSIS — U099 Post covid-19 condition, unspecified: Secondary | ICD-10-CM | POA: Insufficient documentation

## 2020-05-06 NOTE — Therapy (Signed)
[place], [place], [address] Phone: [phone]   Fax:  [phone]  Physical Therapy Evaluation  Patient Details  Name: Madeline Miles MRN: [id number] Date of Birth: 1950/07/24 Referring Provider (PT): Lazaro Arms, NP    Encounter Date: 05/06/2020   PT End of Session - 05/06/20 1301    Visit Number 1    Number of Visits 1    PT Start Time 1140   10 min late   PT Stop Time 1215    PT Time Calculation (min) 35 min    Activity Tolerance Patient tolerated treatment well    Behavior During Therapy [facility] for tasks assessed/performed           Past Medical History:  Diagnosis Date  . Asthma   . Cancer ([place])   . Hypertension     Past Surgical History:  Procedure Laterality Date  . BREAST SURGERY    . CESAREAN SECTION    . TUBAL LIGATION      There were no vitals filed for this visit.    Subjective Assessment - 05/06/20 1150    Subjective Patient had COVID-19 in Feb 2021.  She was not hospitalized.  It was until June that she was very limited. She is gradually building her walking distance.  She is having difficulty walking now, becomes winded more easily.  Denies pain, sensory changes or dizziness.  Rarely has Rt foot pain with walking.    Pertinent History asthma, knee pain    Limitations Standing;Walking    Patient Stated Goals Patient wants to be able to walk her 4 mile route.    Currently in Pain? 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    Leisure Walking, bowling , son lives here       Associate Professor   Overall Cognitive Status Within Functional Limits for tasks assessed      Observation/Other Assessments   Focus on Therapeutic Outcomes (FOTO)  NT 1 time eval       Sensation   Light Touch Appears Intact      Posture/Postural Control   Posture Comments not remarkable       Strength   Right Hand Grip (lbs) 54    Left Hand Grip (lbs) 60      6 Minute Walk- Baseline   6 Minute Walk- Baseline yes    HR (bpm) 127    02 Sat (%RA) 91 %    Perceived Rate of Exertion ([place]) 9- very light      6 minute walk test results    Aerobic Endurance Distance Walked 1750  Objective measurements completed on examination: See above findings.             Plan - 05/06/20 1302    Clinical Impression Statement Patient seen for 1 time eval of PT due to deconditioning following COVID-19 infection . She has normal strength and was able to maintain a fast pace for 6 min with minor drop in SaO2 (91%) .  She was encouraged to continue building her endurance gradually with walking times.  She is able to walk 30 min at this time with mild dyspnea. Understands how to monitor warning signs. No skilled PT indicated.    Personal Factors and Comorbidities Comorbidity 1    Comorbidities asthma    Examination-Activity Limitations Locomotion Level    Examination-Participation Restrictions Other    Stability/Clinical Decision Making Stable/Uncomplicated    Clinical Decision Making Low    Rehab Potential Excellent    PT Frequency One time visit    PT Treatment/Interventions ADLs/Self Care Home Management    PT Next Visit Plan NA    PT Home Exercise Plan NA    Recommended Other Services Pulmonary Rehab    Consulted and Agree with Plan of Care Patient           Patient will benefit from skilled  therapeutic intervention in order to improve the following deficits and impairments:  Cardiopulmonary status limiting activity  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 History of COVID-19 04/22/2020  . Shortness of breath 04/22/2020  . History of asthma 04/22/2020  . Physical deconditioning 04/22/2020  . Endometrial polyp 02/21/2019  . Essential hypertension 03/28/2018  . History of breast cancer 03/28/2018  . Non-seasonal allergic rhinitis 03/28/2018    Madeline Miles 05/06/2020, 1:08 PM  [facility] [address] [place], [place], [address] Phone: [phone]   Fax:  [phone]  Name: Madeline Miles MRN: [id number] Date of Birth: 03/04/1951   Raeford Razor, PT 05/06/20 1:08 PM Phone: [phone] Fax: [phone]

## 2020-05-11 ENCOUNTER — Telehealth: Payer: Self-pay | Admitting: Emergency Medicine

## 2020-05-11 NOTE — Telephone Encounter (Signed)
[facility] phone numerous times but no one answered.

## 2020-05-12 NOTE — Telephone Encounter (Signed)
[place] medical records dept and spoke to [place]. He states he will fax the PFT on pt to front fax.   Will forward to [place] to look out for.

## 2020-05-15 NOTE — Telephone Encounter (Signed)
Thank you :)

## 2020-05-15 NOTE — Telephone Encounter (Signed)
Paperwork on this patient has been received and is in [employer]. Forwarding to [place] as FYI.  Please advise if anything further is needed, thanks!

## 2020-05-26 ENCOUNTER — Ambulatory Visit (INDEPENDENT_AMBULATORY_CARE_PROVIDER_SITE_OTHER): Payer: Medicare Other | Admitting: Nurse Practitioner

## 2020-05-26 VITALS — BP 136/82 | HR 65 | Temp 97.1°F | Ht 63.0 in | Wt 182.0 lb

## 2020-05-26 DIAGNOSIS — Z8616 Personal history of COVID-19: Secondary | ICD-10-CM

## 2020-05-26 DIAGNOSIS — Z8709 Personal history of other diseases of the respiratory system: Secondary | ICD-10-CM

## 2020-05-26 NOTE — Progress Notes (Signed)
@Patient  ID: Madeline Miles, female    DOB: 10-05-1950, 69 y.o.   MRN: [id number]  Chief Complaint  Patient presents with  . Follow-up    1 month follow up. No sx. No questions/concerns    Referring provider: No ref. provider found  69 year old female with history of hypertension, asthma, breast cancer.   HPI  Patient presents today for post COVID care clinic visit.  She was diagnosed with Covid in February 2021.  Patient does have a history of asthma.  She does currently use albuterol, Qvar, and Singulair.  Patient has recently moved here from [place]  She was last seen here on 04/21/20. She was referred to pulmonary and has been seen by Dr. Lamonte Sakai. She is scheduled for PFT on 06/25/20. She states that since her last visit here she is much improved. No longer having shortness of breath. She has participated in PT since her last visit here.    Allergies  Allergen Reactions  . Pseudoephedrine Hcl Other (See Comments)    Immunization History  Administered Date(s) Administered  . Fluad Quad(high Dose 65+) 04/12/2019, 04/01/2020  . Influenza, High Dose Seasonal PF 04/28/2016, 04/11/2019  . Influenza,inj,quad, With Preservative 06/27/2016  . Influenza-Unspecified 04/29/2013, 03/31/2014, 04/27/2016  . PFIZER SARS-COV-2 Vaccination 09/03/2019, 10/01/2019, 04/01/2020  . Pneumococcal Conjugate-13 08/06/2014, 03/28/2018  . Pneumococcal Polysaccharide-23 10/27/2015, 02/26/2018  . Tdap 07/12/2016  . Zoster 07/29/2015  . Zoster Recombinat (Shingrix) 11/28/2017    Past Medical History:  Diagnosis Date  . Asthma   . Cancer ([place])   . Hypertension     Tobacco History: Social History   Tobacco Use  Smoking Status Never Smoker  Smokeless Tobacco Never Used   Counseling given: Not Answered   Outpatient Encounter Medications as of 05/26/2020  Medication Sig  . albuterol (PROAIR HFA) 108 (90 Base) MCG/ACT inhaler ProAir HFA 90 mcg/actuation aerosol inhaler  Inhale 2 puffs every  4 hours by inhalation route as needed.  Marland Kitchen albuterol (PROVENTIL) (2.5 MG/3ML) 0.083% nebulizer solution albuterol sulfate 2.5 mg/3 mL (0.083 %) solution for nebulization  INHALE 3 MILLILITER BY NEBULIZATION ROUTE 4 TIMES EVERY DAY  . amLODipine (NORVASC) 10 MG tablet Take 10 mg by mouth daily.  . beclomethasone (QVAR) 80 MCG/ACT inhaler Qvar 80 mcg/actuation Metered Aerosol oral inhaler  INHALE 1 PUFF BY INHALATION ROUTE 2 TIMES EVERY DAY  . budesonide-formoterol (SYMBICORT) 160-4.5 MCG/ACT inhaler Inhale into the lungs.  . Calcium Carbonate-Vit D-Min (CALTRATE 600+D PLUS PO) Caltrate  WITH VITAMIN D  . Cetirizine HCl (ZYRTEC ALLERGY) 10 MG CAPS Zyrtec 10 mg capsule  1 TAB DAILY  . Cholecalciferol (EQL VITAMIN D3) 2000 units CAPS Take 1 capsule by mouth daily.  . IRON PO Take 1 capsule by mouth.   . meclizine (ANTIVERT) 25 MG tablet meclizine 25 mg tablet  Take 1 tablet 3 times a day by oral route as needed.  . montelukast (SINGULAIR) 10 MG tablet montelukast 10 mg tablet  TAKE 1 TABLET BY ORAL ROUTE  EVERY DAY IN THE EVENING  . multivitamin-iron-minerals-folic acid (CENTRUM) chewable tablet Chew 1 tablet by mouth daily.  . celecoxib (CELEBREX) 200 MG capsule Celebrex 200 mg capsule  CELEBREX 200MG , 1 CAPSULE DAILY # 30, 08/05/2011, REF. X5. INACTIVE (Patient not taking: Reported on 05/06/2020)  . cyclobenzaprine (FLEXERIL) 10 MG tablet Take 0.5-1 tablets (5-10 mg total) by mouth 3 (three) times daily as needed for muscle spasms. (Patient not taking: Reported on 05/26/2020)   No facility-administered encounter medications on file as of  05/26/2020.     Review of Systems  Review of Systems  Constitutional: Negative.  Negative for fever.  HENT: Negative.   Respiratory: Negative for cough and shortness of breath.   Cardiovascular: Negative.  Negative for chest pain, palpitations and leg swelling.  Gastrointestinal: Negative.   Allergic/Immunologic: Negative.   Neurological: Negative.    Psychiatric/Behavioral: Negative.        Physical Exam  BP 136/82 (BP Location: Left Arm)   Pulse 65   Temp (!) 97.1 F (36.2 C)   Ht 5\' 3"  (1.6 m)   Wt 182 lb 0.1 oz (82.6 kg)   SpO2 99%   BMI 32.24 kg/m   Wt Readings from Last 5 Encounters:  05/26/20 182 lb 0.1 oz (82.6 kg)  04/29/20 177 lb 6.4 oz (80.5 kg)  04/21/20 181 lb 0.1 oz (82.1 kg)  12/31/16 185 lb (83.9 kg)  07/12/16 184 lb (83.5 kg)     Physical Exam Vitals and nursing note reviewed.  Constitutional:      General: She is not in acute distress.    Appearance: She is well-developed.  Cardiovascular:     Rate and Rhythm: Normal rate and regular rhythm.  Pulmonary:     Effort: Pulmonary effort is normal.     Breath sounds: Normal breath sounds.  Neurological:     Mental Status: She is alert and oriented to person, place, and time.  Psychiatric:        Mood and Affect: Mood normal.        Behavior: Behavior normal.      Lab Results:  CBC No results found for: WBC, RBC, HGB, HCT, PLT, MCV, MCH, MCHC, RDW, LYMPHSABS, MONOABS, EOSABS, BASOSABS  BMET No results found for: NA, K, CL, CO2, GLUCOSE, BUN, CREATININE, CALCIUM, GFRNONAA, GFRAA  BNP No results found for: BNP  ProBNP No results found for: PROBNP  Imaging: No results found.   Assessment & Plan:   History of COVID-19 Shortness of breath History of Asthma Deconditioning:   Stay well hydrated  Stay active  Deep breathing exercises  May start vitamin C 2,000 mg daily, vitamin D3 2,000 IU daily, Zinc 220 mg daily   Please keep upcoming appointment pulmonary  Continue home PT     Follow up:  Follow up if needed     Fenton Foy, NP 05/26/2020

## 2020-05-26 NOTE — Patient Instructions (Addendum)
History of Covid 19 Shortness of breath History of Asthma Deconditioning:   Stay well hydrated  Stay active  Deep breathing exercises  May start vitamin C 2,000 mg daily, vitamin D3 2,000 IU daily, Zinc 220 mg daily   Please keep upcoming appointment pulmonary  Continue home PT     Follow up:  Follow up if needed

## 2020-05-26 NOTE — Assessment & Plan Note (Signed)
Shortness of breath History of Asthma Deconditioning:   Stay well hydrated  Stay active  Deep breathing exercises  May start vitamin C 2,000 mg daily, vitamin D3 2,000 IU daily, Zinc 220 mg daily   Please keep upcoming appointment pulmonary  Continue home PT     Follow up:  Follow up if needed

## 2020-06-25 ENCOUNTER — Ambulatory Visit: Payer: Medicare Other | Admitting: Emergency Medicine

## 2020-06-25 ENCOUNTER — Other Ambulatory Visit: Payer: Self-pay

## 2020-06-25 ENCOUNTER — Encounter: Payer: Self-pay | Admitting: Primary Care

## 2020-06-25 ENCOUNTER — Ambulatory Visit (INDEPENDENT_AMBULATORY_CARE_PROVIDER_SITE_OTHER): Payer: Medicare Other | Admitting: Primary Care

## 2020-06-25 ENCOUNTER — Ambulatory Visit (INDEPENDENT_AMBULATORY_CARE_PROVIDER_SITE_OTHER): Payer: Medicare Other | Admitting: Emergency Medicine

## 2020-06-25 DIAGNOSIS — Z8616 Personal history of COVID-19: Secondary | ICD-10-CM

## 2020-06-25 DIAGNOSIS — J4530 Mild persistent asthma, uncomplicated: Secondary | ICD-10-CM | POA: Insufficient documentation

## 2020-06-25 LAB — PULMONARY FUNCTION TEST
DL/VA % pred: 103 %
DL/VA: 4.89 ml/min/mmHg/L
DLCO cor % pred: 72 %
DLCO cor: 17.01 ml/min/mmHg
DLCO unc % pred: 72 %
DLCO unc: 17.01 ml/min/mmHg
FEF 25-75 Post: 2.33 L/sec
FEF 25-75 Pre: 2.08 L/sec
FEF2575-%Change-Post: 12 %
FEF2575-%Pred-Post: 142 %
FEF2575-%Pred-Pre: 127 %
FEV1-%Change-Post: 4 %
FEV1-%Pred-Post: 94 %
FEV1-%Pred-Pre: 90 %
FEV1-Post: 1.78 L
FEV1-Pre: 1.7 L
FEV1FVC-%Change-Post: 1 %
FEV1FVC-%Pred-Pre: 109 %
FEV6-%Change-Post: 3 %
FEV6-%Pred-Post: 91 %
FEV6-%Pred-Pre: 88 %
FEV6-Post: 2.03 L
FEV6-Pre: 1.97 L
FEV6FVC-%Pred-Post: 107 %
FEV6FVC-%Pred-Pre: 107 %
FVC-%Change-Post: 3 %
FVC-%Pred-Post: 84 %
FVC-%Pred-Pre: 82 %
FVC-Post: 2.03 L
FVC-Pre: 1.97 L
Post FEV1/FVC ratio: 88 %
Post FEV6/FVC ratio: 100 %
Pre FEV1/FVC ratio: 86 %
Pre FEV6/FVC Ratio: 100 %
RV % pred: 69 %
RV: 1.5 L
TLC % pred: 77 %
TLC: 3.88 L

## 2020-06-25 NOTE — Progress Notes (Signed)
@Patient  ID: Madeline Miles, female    DOB: [birth year], 69 y.o.   MRN: [id number]  Chief Complaint  Patient presents with  . Follow-up    PFT performed today.  Pt states she has been doing okay since last visit. States she still is coughing at night and some in morning when first wakes up which will clear up as the day goes on. Pt also has some wheezing.    Referring provider: Lucianne Lei, MD  HPI: 70 year old female, never smoked. PMH significant for asthma, HTN, non-seasonal allergic rhinitis, breast cancer, hx covid-19.   Previous [facility] pulmonary encounter: 04/29/20- Dr. Lamonte Sakai consult 69 year old never smoker with a history of hypertension, allergic rhinitis, angioedema, asthma that was originally evaluated in [place].  She was diagnosed with COVID-19 pneumonia in February 2021.  She reports that she had throat irritation, URI sx, fevers, developed cough and phlegm, progressive SOB. Received prednisone, did not get the monoclonal Ab. After about a month was treated with abx and did improve some. Was temporarily changed QVAR to Symbicort, now back to the QVAR. She is trying to get back to walking, but is not able to do as much as pre-COVID.  She is having more wheeze, cough than her prior baseline. Her last PFT were with her [place] pulmonologist  She is now [place] vaccinated.   Currently uses allegra, is off singulair. She is using QVAR, but prn.   Chest x-ray 04/24/2020 reviewed by me, shows no significant infiltrates.  06/25/2020- Interim hx  Patient presents today for 6 week follow-up with PFTs. She has hx of asthma, more symptomatic when she had covid-19. She reported residual dyspnea and wheezing. She was started on Qvar. Reports that inhaler does help, allows her to sleep without coughing at night. She has productive cough in the morning and evening. She uses QVAR 1 PUFF twice a day. She recently restarted Singulair. No significant shortness of breath, chest tightness or wheezing.     Pulmonary testing:  11/12/19 Spirometry - FVC 2.04 (87%), FEV1 1.61(88%), ratio 79, TLC 3.14 (66%) Impression: Spirometry normal, lung volumes mild-moderately reduced. Diffusion capacity mildly reduced  06/25/2020 PFTs - FVC 1.97 (82%), FEV1 1.70 (90%), ratio 86, TLC 3.88 (77%), DLCOunc 17.01 (72%), DL/VA 3.48 (69%) Impression: Mild restriction with moderate diffusion defect. No bronchodilator response.   Imaging: CXR 11/12/19- showed clear lungs (outside records) CXR 04/24/20 - lungs clear  Allergies  Allergen Reactions  . Pseudoephedrine Hcl Other (See Comments)    Immunization History  Administered Date(s) Administered  . Fluad Quad(high Dose 65+) 04/12/2019, 04/01/2020  . Influenza, High Dose Seasonal PF 04/28/2016, 04/11/2019  . Influenza,inj,quad, With Preservative 06/27/2016  . Influenza-Unspecified 04/29/2013, 03/31/2014, 04/27/2016  . PFIZER SARS-COV-2 Vaccination 09/03/2019, 10/01/2019, 04/01/2020  . Pneumococcal Conjugate-13 08/06/2014, 03/28/2018  . Pneumococcal Polysaccharide-23 10/27/2015, 02/26/2018  . Tdap 07/12/2016  . Zoster 07/29/2015  . Zoster Recombinat (Shingrix) 11/28/2017    Past Medical History:  Diagnosis Date  . Asthma   . Cancer ([place])   . Hypertension     Tobacco History: Social History   Tobacco Use  Smoking Status Never Smoker  Smokeless Tobacco Never Used   Counseling given: Not Answered   Outpatient Medications Prior to Visit  Medication Sig Dispense Refill  . albuterol (PROVENTIL) (2.5 MG/3ML) 0.083% nebulizer solution albuterol sulfate 2.5 mg/3 mL (0.083 %) solution for nebulization  INHALE 3 MILLILITER BY NEBULIZATION ROUTE 4 TIMES EVERY DAY    . albuterol (VENTOLIN HFA) 108 (90 Base) MCG/ACT inhaler  ProAir HFA 90 mcg/actuation aerosol inhaler  Inhale 2 puffs every 4 hours by inhalation route as needed.    Marland Kitchen amLODipine (NORVASC) 10 MG tablet Take 10 mg by mouth daily.    . beclomethasone (QVAR) 80 MCG/ACT inhaler Qvar 80  mcg/actuation Metered Aerosol oral inhaler  INHALE 1 PUFF BY INHALATION ROUTE 2 TIMES EVERY DAY    . Calcium Carbonate-Vit D-Min (CALTRATE 600+D PLUS PO) Caltrate  WITH VITAMIN D    . Cetirizine HCl 10 MG CAPS Zyrtec 10 mg capsule  1 TAB DAILY    . Cholecalciferol 50 MCG (2000 UT) CAPS Take 1 capsule by mouth daily.    Marland Kitchen ELDERBERRY PO Take by mouth.    . IRON PO Take 1 capsule by mouth.     . meclizine (ANTIVERT) 25 MG tablet meclizine 25 mg tablet  Take 1 tablet 3 times a day by oral route as needed.    . montelukast (SINGULAIR) 10 MG tablet montelukast 10 mg tablet  TAKE 1 TABLET BY ORAL ROUTE  EVERY DAY IN THE EVENING    . Multiple Vitamins-Minerals (EMERGEN-C IMMUNE PO) Take by mouth.    . multivitamin-iron-minerals-folic acid (CENTRUM) chewable tablet Chew 1 tablet by mouth daily.    . budesonide-formoterol (SYMBICORT) 160-4.5 MCG/ACT inhaler Inhale into the lungs.    . celecoxib (CELEBREX) 200 MG capsule Celebrex 200 mg capsule  CELEBREX 200MG , 1 CAPSULE DAILY # 30, 08/05/2011, REF. X5. INACTIVE    . cyclobenzaprine (FLEXERIL) 10 MG tablet Take 0.5-1 tablets (5-10 mg total) by mouth 3 (three) times daily as needed for muscle spasms. 30 tablet 0   No facility-administered medications prior to visit.   Review of Systems  Review of Systems  Constitutional: Negative.   Respiratory: Positive for cough. Negative for chest tightness, shortness of breath and wheezing.   Cardiovascular: Negative.    Physical Exam  BP 124/80 (BP Location: Left Arm, Cuff Size: Normal)   Pulse 63   Ht 5' 3.5" (1.613 m)   Wt 185 lb (83.9 kg)   SpO2 97%   BMI 32.26 kg/m  Physical Exam Constitutional:      Appearance: Normal appearance.  HENT:     Head: Normocephalic and atraumatic.     Mouth/Throat:     Mouth: Mucous membranes are moist.     Pharynx: Oropharynx is clear.  Cardiovascular:     Rate and Rhythm: Normal rate and regular rhythm.     Comments: Trace BLE edema, non-pitting   Pulmonary:     Effort: Pulmonary effort is normal.     Breath sounds: Normal breath sounds. No wheezing, rhonchi or rales.  Musculoskeletal:     Cervical back: Normal range of motion and neck supple.  Neurological:     General: No focal deficit present.     Mental Status: She is alert and oriented to person, place, and time. Mental status is at baseline.  Psychiatric:        Mood and Affect: Mood normal.        Behavior: Behavior normal.        Thought Content: Thought content normal.        Judgment: Judgment normal.      Lab Results:  CBC No results found for: WBC, RBC, HGB, HCT, PLT, MCV, MCH, MCHC, RDW, LYMPHSABS, MONOABS, EOSABS, BASOSABS  BMET No results found for: NA, K, CL, CO2, GLUCOSE, BUN, CREATININE, CALCIUM, GFRNONAA, GFRAA  BNP No results found for: BNP  ProBNP No results found for: PROBNP  Imaging: No results found.   Assessment & Plan:   Mild persistent asthma PFTs today showed mild restriction with moderate diffusion defect. No bronchodilator response. Not significantly changed from previous spirometry testing at outside facility in May 2021. CXR has been clear. Results are consistent with known hx asthma and covid-19. Reports improvement in breathing and cough with Qvar. She is sleeping better at night.  Plan: - Continue Qvar, instructed patient to take TWO puffs twice a day (rinse mouth after use) - Resume singulair 10mg  at bedtime  Follow-up: - 6 months with Dr. Gigi Gin, NP 06/25/2020

## 2020-06-25 NOTE — Patient Instructions (Addendum)
Pulmonary function testing appears baseline, similar to previous from May 2021 Mild restriction and diffusion capacity consistent with known asthma and hx covid-19   Recommendations:  Continue Qvar- take two puffs morning and evening (rinse mouth after use) Resume Singulair 10mg  at bedtime Use albuterol rescue inhaler 2 puff every 6 hours for breakthrough shortness of breath/wheezing  Follow-up: 6 months with Dr. Lamonte Sakai or sooner if needed     Asthma, Adult  Asthma is a long-term (chronic) condition in which the airways get tight and narrow. The airways are the breathing passages that lead from the nose and mouth down into the lungs. A person with asthma will have times when symptoms get worse. These are called asthma attacks. They can cause coughing, whistling sounds when you breathe (wheezing), shortness of breath, and chest pain. They can make it hard to breathe. There is no cure for asthma, but medicines and lifestyle changes can help control it. There are many things that can bring on an asthma attack or make asthma symptoms worse (triggers). Common triggers include:  Mold.  Dust.  Cigarette smoke.  Cockroaches.  Things that can cause allergy symptoms (allergens). These include animal skin flakes (dander) and pollen from trees or grass.  Things that pollute the air. These may include household cleaners, wood smoke, smog, or chemical odors.  Cold air, weather changes, and wind.  Crying or laughing hard.  Stress.  Certain medicines or drugs.  Certain foods such as dried fruit, potato chips, and grape juice.  Infections, such as a cold or the flu.  Certain medical conditions or diseases.  Exercise or tiring activities. Asthma may be treated with medicines and by staying away from the things that cause asthma attacks. Types of medicines may include:  Controller medicines. These help prevent asthma symptoms. They are usually taken every day.  Fast-acting reliever or  rescue medicines. These quickly relieve asthma symptoms. They are used as needed and provide short-term relief.  Allergy medicines if your attacks are brought on by allergens.  Medicines to help control the body's defense (immune) system. Follow these instructions at home: Avoiding triggers in your home  Change your heating and air conditioning filter often.  Limit your use of fireplaces and wood stoves.  Get rid of pests (such as roaches and mice) and their droppings.  Throw away plants if you see mold on them.  Clean your floors. Dust regularly. Use cleaning products that do not smell.  Have someone vacuum when you are not home. Use a vacuum cleaner with a HEPA filter if possible.  Replace carpet with wood, tile, or vinyl flooring. Carpet can trap animal skin flakes and dust.  Use allergy-proof pillows, mattress covers, and box spring covers.  Wash bed sheets and blankets every week in hot water. Dry them in a dryer.  Keep your bedroom free of any triggers.  Avoid pets and keep windows closed when things that cause allergy symptoms are in the air.  Use blankets that are made of polyester or cotton.  Clean bathrooms and kitchens with bleach. If possible, have someone repaint the walls in these rooms with mold-resistant paint. Keep out of the rooms that are being cleaned and painted.  Wash your hands often with soap and water. If soap and water are not available, use hand sanitizer.  Do not allow anyone to smoke in your home. General instructions  Take over-the-counter and prescription medicines only as told by your doctor. ? Talk with your doctor if you have questions  about how or when to take your medicines. ? Make note if you need to use your medicines more often than usual.  Do not use any products that contain nicotine or tobacco, such as cigarettes and e-cigarettes. If you need help quitting, ask your doctor.  Stay away from secondhand smoke.  Avoid doing things  outdoors when allergen counts are high and when air quality is low.  Wear a ski mask when doing outdoor activities in the winter. The mask should cover your nose and mouth. Exercise indoors on cold days if you can.  Warm up before you exercise. Take time to cool down after exercise.  Use a peak flow meter as told by your doctor. A peak flow meter is a tool that measures how well the lungs are working.  Keep track of the peak flow meter's readings. Write them down.  Follow your asthma action plan. This is a written plan for taking care of your asthma and treating your attacks.  Make sure you get all the shots (vaccines) that your doctor recommends. Ask your doctor about a flu shot and a pneumonia shot.  Keep all follow-up visits as told by your doctor. This is important. Contact a doctor if:  You have wheezing, shortness of breath, or a cough even while taking medicine to prevent attacks.  The mucus you cough up (sputum) is thicker than usual.  The mucus you cough up changes from clear or white to yellow, green, gray, or bloody.  You have problems from the medicine you are taking, such as: ? A rash. ? Itching. ? Swelling. ? Trouble breathing.  You need reliever medicines more than 2-3 times a week.  Your peak flow reading is still at 50-79% of your personal best after following the action plan for 1 hour.  You have a fever. Get help right away if:  You seem to be worse and are not responding to medicine during an asthma attack.  You are short of breath even at rest.  You get short of breath when doing very little activity.  You have trouble eating, drinking, or talking.  You have chest pain or tightness.  You have a fast heartbeat.  Your lips or fingernails start to turn blue.  You are light-headed or dizzy, or you faint.  Your peak flow is less than 50% of your personal best.  You feel too tired to breathe normally. Summary  Asthma is a long-term (chronic)  condition in which the airways get tight and narrow. An asthma attack can make it hard to breathe.  Asthma cannot be cured, but medicines and lifestyle changes can help control it.  Make sure you understand how to avoid triggers and how and when to use your medicines. This information is not intended to replace advice given to you by your health care provider. Make sure you discuss any questions you have with your health care provider. Document Revised: 08/16/2018 Document Reviewed: 07/18/2016 Elsevier Patient Education  2020 [employer].

## 2020-06-25 NOTE — Assessment & Plan Note (Signed)
PFTs today showed mild restriction with moderate diffusion defect. No bronchodilator response. Not significantly changed from previous spirometry testing at outside facility in May 2021. CXR has been clear. Results are consistent with known hx asthma and covid-19. Reports improvement in breathing and cough with Qvar. She is sleeping better at night.  Plan: - Continue Qvar, instructed patient to take TWO puffs twice a day (rinse mouth after use) - Resume singulair 10mg  at bedtime  Follow-up: - 6 months with Dr. 

## 2020-06-25 NOTE — Progress Notes (Signed)
Full PFT completed today ? ?

## 2021-02-19 ENCOUNTER — Emergency Department (HOSPITAL_COMMUNITY): Payer: Medicare Other

## 2021-02-19 ENCOUNTER — Other Ambulatory Visit: Payer: Self-pay

## 2021-02-19 ENCOUNTER — Emergency Department (HOSPITAL_COMMUNITY)
Admission: EM | Admit: 2021-02-19 | Discharge: 2021-02-19 | Disposition: A | Discharge status: Home / Self Care | Payer: Medicare Other | Attending: Emergency Medicine | Admitting: Emergency Medicine

## 2021-02-19 ENCOUNTER — Encounter (HOSPITAL_COMMUNITY): Payer: Self-pay

## 2021-02-19 DIAGNOSIS — J4530 Mild persistent asthma, uncomplicated: Secondary | ICD-10-CM | POA: Diagnosis not present

## 2021-02-19 DIAGNOSIS — I10 Essential (primary) hypertension: Secondary | ICD-10-CM | POA: Insufficient documentation

## 2021-02-19 DIAGNOSIS — M791 Myalgia, unspecified site: Secondary | ICD-10-CM | POA: Diagnosis not present

## 2021-02-19 DIAGNOSIS — R791 Abnormal coagulation profile: Secondary | ICD-10-CM | POA: Diagnosis not present

## 2021-02-19 DIAGNOSIS — Z8616 Personal history of COVID-19: Secondary | ICD-10-CM | POA: Insufficient documentation

## 2021-02-19 DIAGNOSIS — Z7951 Long term (current) use of inhaled steroids: Secondary | ICD-10-CM | POA: Insufficient documentation

## 2021-02-19 DIAGNOSIS — R202 Paresthesia of skin: Secondary | ICD-10-CM | POA: Insufficient documentation

## 2021-02-19 DIAGNOSIS — Y900 Blood alcohol level of less than 20 mg/100 ml: Secondary | ICD-10-CM | POA: Insufficient documentation

## 2021-02-19 DIAGNOSIS — Z853 Personal history of malignant neoplasm of breast: Secondary | ICD-10-CM | POA: Diagnosis not present

## 2021-02-19 DIAGNOSIS — R531 Weakness: Secondary | ICD-10-CM | POA: Diagnosis present

## 2021-02-19 DIAGNOSIS — Z79899 Other long term (current) drug therapy: Secondary | ICD-10-CM | POA: Insufficient documentation

## 2021-02-19 LAB — I-STAT CHEM 8, ED
BUN: 11 mg/dL (ref 8–23)
Calcium, Ion: 1.2 mmol/L (ref 1.15–1.40)
Chloride: 105 mmol/L (ref 98–111)
Creatinine, Ser: 0.9 mg/dL (ref 0.44–1.00)
Glucose, Bld: 106 mg/dL — ABNORMAL HIGH (ref 70–99)
HCT: 40 % (ref 36.0–46.0)
Hemoglobin: 13.6 g/dL (ref 12.0–15.0)
Potassium: 4 mmol/L (ref 3.5–5.1)
Sodium: 142 mmol/L (ref 135–145)
TCO2: 29 mmol/L (ref 22–32)

## 2021-02-19 LAB — PROTIME-INR
INR: 1 (ref 0.8–1.2)
Prothrombin Time: 12.8 seconds (ref 11.4–15.2)

## 2021-02-19 LAB — DIFFERENTIAL
Abs Immature Granulocytes: 0 10*3/uL (ref 0.00–0.07)
Basophils Absolute: 0 10*3/uL (ref 0.0–0.1)
Basophils Relative: 1 %
Eosinophils Absolute: 0.1 10*3/uL (ref 0.0–0.5)
Eosinophils Relative: 2 %
Immature Granulocytes: 0 %
Lymphocytes Relative: 35 %
Lymphs Abs: 2 10*3/uL (ref 0.7–4.0)
Monocytes Absolute: 0.4 10*3/uL (ref 0.1–1.0)
Monocytes Relative: 6 %
Neutro Abs: 3.2 10*3/uL (ref 1.7–7.7)
Neutrophils Relative %: 56 %

## 2021-02-19 LAB — COMPREHENSIVE METABOLIC PANEL
ALT: 18 U/L (ref 0–44)
AST: 23 U/L (ref 15–41)
Albumin: 3.7 g/dL (ref 3.5–5.0)
Alkaline Phosphatase: 66 U/L (ref 38–126)
Anion gap: 9 (ref 5–15)
BUN: 12 mg/dL (ref 8–23)
CO2: 26 mmol/L (ref 22–32)
Calcium: 9.1 mg/dL (ref 8.9–10.3)
Chloride: 103 mmol/L (ref 98–111)
Creatinine, Ser: 0.94 mg/dL (ref 0.44–1.00)
GFR, Estimated: >60 mL/min (ref >60)
Glucose, Bld: 106 mg/dL — ABNORMAL HIGH (ref 70–99)
Potassium: 3.7 mmol/L (ref 3.5–5.1)
Sodium: 138 mmol/L (ref 135–145)
Total Bilirubin: 0.8 mg/dL (ref 0.3–1.2)
Total Protein: 7.5 g/dL (ref 6.5–8.1)

## 2021-02-19 LAB — CBC
HCT: 41.1 % (ref 36.0–46.0)
Hemoglobin: 12.4 g/dL (ref 12.0–15.0)
MCH: 22.3 pg — ABNORMAL LOW (ref 26.0–34.0)
MCHC: 30.2 g/dL (ref 30.0–36.0)
MCV: 73.8 fL — ABNORMAL LOW (ref 80.0–100.0)
Platelets: 301 10*3/uL (ref 150–400)
RBC: 5.57 MIL/uL — ABNORMAL HIGH (ref 3.87–5.11)
RDW: 16.4 % — ABNORMAL HIGH (ref 11.5–15.5)
WBC: 5.6 10*3/uL (ref 4.0–10.5)
nRBC: 0 % (ref 0.0–0.2)

## 2021-02-19 LAB — ETHANOL: Alcohol, Ethyl (B): <10 mg/dL (ref <10)

## 2021-02-19 LAB — APTT: aPTT: 28 seconds (ref 24–36)

## 2021-02-19 MED ORDER — SODIUM CHLORIDE 0.9 % IV BOLUS
1000.0000 mL | Freq: Once | INTRAVENOUS | Status: AC
  Administered 2021-02-19: 1000 mL via INTRAVENOUS

## 2021-02-19 NOTE — ED Provider Notes (Signed)
[place] DEPT Provider Note   CSN: [id number] Arrival date & time: 02/19/21  [id number]     History Chief Complaint  Patient presents with   Numbness   Blurred Vision    Madeline Miles is a 70 y.o. female.  70 yo F with a chief complaints of right arm weakness.  She noticed this this morning when she woke up.  Started feeling a bit tingly and also feels like she has trouble with her vision in the right periphery.  The vision seems improved when she closes 1 eye.  Denies rash.  Denies headache denies neck pain.  Denies head trauma.  Was fine yesterday.  She denies any recent medication changes.  Denies chest pain or trouble breathing.  The history is provided by the patient.  Illness Severity:  Moderate Onset quality:  Gradual Duration:  3 hours Timing:  Constant Progression:  Unchanged Chronicity:  New Associated symptoms: myalgias   Associated symptoms: no chest pain, no congestion, no fever, no headaches, no nausea, no rhinorrhea, no shortness of breath, no vomiting and no wheezing       Past Medical History:  Diagnosis Date   Asthma    Cancer ([place])    Hypertension     Patient Active Problem List   Diagnosis Date Noted   Mild persistent asthma 06/25/2020   History of COVID-19 04/22/2020   Shortness of breath 04/22/2020   History of asthma 04/22/2020   Physical deconditioning 04/22/2020   Endometrial polyp 02/21/2019   Essential hypertension 03/28/2018   History of breast cancer 03/28/2018   Non-seasonal allergic rhinitis 03/28/2018    Past Surgical History:  Procedure Laterality Date   BREAST SURGERY     CESAREAN SECTION     TUBAL LIGATION       OB History   No obstetric history on file.     Family History  Problem Relation Age of Onset   Heart disease Mother    Hyperlipidemia Mother    Hypertension Mother    Hypertension Father    Stroke Father    Diabetes Sister    Hypertension Sister    Hyperlipidemia Sister     Heart disease Brother    Heart disease Brother    Hypertension Brother    Heart disease Sister    Hypertension Sister     Social History   Tobacco Use   Smoking status: Never   Smokeless tobacco: Never  Vaping Use   Vaping Use: Never used  Substance Use Topics   Alcohol use: No   Drug use: No    Home Medications Prior to Admission medications   Medication Sig Start Date End Date Taking? Authorizing Provider  acetaminophen (TYLENOL) 500 MG tablet Take 1,000 mg by mouth every 6 (six) hours as needed for mild pain or headache.   Yes [provider]  albuterol (PROVENTIL) (2.5 MG/3ML) 0.083% nebulizer solution Take 2.5 mg by nebulization every 4 (four) hours as needed for wheezing or shortness of breath. 12/18/12  Yes [provider]  albuterol (VENTOLIN HFA) 108 (90 Base) MCG/ACT inhaler Inhale 2 puffs into the lungs every 4 (four) hours as needed for wheezing or shortness of breath.   Yes [provider]  amLODipine (NORVASC) 10 MG tablet Take 10 mg by mouth daily.   Yes [provider]  Calcium Carbonate-Vit D-Min (CALTRATE 600+D PLUS PO) Take 1 tablet by mouth daily.   Yes [provider]  Cholecalciferol 50 MCG (2000 UT) CAPS Take  1 capsule by mouth daily.   Yes [provider]  ELDERBERRY PO Take 1 tablet by mouth daily.   Yes [provider]  fexofenadine (ALLEGRA) 180 MG tablet Take 180 mg by mouth daily as needed for allergies or rhinitis.   Yes [provider]  fluticasone (FLONASE) 50 MCG/ACT nasal spray Place 2 sprays into both nostrils at bedtime.   Yes [provider]  IRON PO Take 1 capsule by mouth daily.   Yes [provider]  Multiple Vitamins-Minerals (EMERGEN-C IMMUNE PO) Take 1 tablet by mouth daily.   Yes [provider]  multivitamin-iron-minerals-folic acid (CENTRUM) chewable tablet Chew 1 tablet by mouth daily.   Yes [provider]  Propylene Glycol  (SYSTANE BALANCE OP) Place 1 drop into both eyes daily.   Yes [provider]  Turmeric (QC TUMERIC COMPLEX PO) Take 1 tablet by mouth daily.   Yes [provider]    Allergies    Pseudoephedrine hcl  Review of Systems   Review of Systems  Constitutional:  Negative for chills and fever.  HENT:  Negative for congestion and rhinorrhea.   Eyes:  Positive for visual disturbance. Negative for redness.  Respiratory:  Negative for shortness of breath and wheezing.   Cardiovascular:  Negative for chest pain and palpitations.  Gastrointestinal:  Negative for nausea and vomiting.  Genitourinary:  Negative for dysuria and urgency.  Musculoskeletal:  Positive for myalgias. Negative for arthralgias.  Skin:  Negative for pallor and wound.  Neurological:  Positive for weakness (R arm). Negative for dizziness and headaches.   Physical Exam Updated Vital Signs BP (!) 145/82 (BP Location: Right Arm)   Pulse 62   Temp 98.2 F (36.8 C) (Oral)   Resp 17   SpO2 100%   Physical Exam Vitals and nursing note reviewed.  Constitutional:      General: She is not in acute distress.    Appearance: She is well-developed. She is not diaphoretic.  HENT:     Head: Normocephalic and atraumatic.  Eyes:     Pupils: Pupils are equal, round, and reactive to light.  Cardiovascular:     Rate and Rhythm: Normal rate and regular rhythm.     Heart sounds: No murmur heard.   No friction rub. No gallop.  Pulmonary:     Effort: Pulmonary effort is normal.     Breath sounds: No wheezing or rales.  Abdominal:     General: There is no distension.     Palpations: Abdomen is soft.     Tenderness: There is no abdominal tenderness.  Musculoskeletal:        General: No tenderness.     Cervical back: Normal range of motion and neck supple.  Skin:    General: Skin is warm and dry.  Neurological:     Mental Status: She is alert and oriented to person, place, and time.     GCS: GCS eye subscore is 4.  GCS verbal subscore is 5. GCS motor subscore is 6.     Comments: Subtle facial asymmetry.  Right upper extremity 4 out of 5 compared to left upper extremity 5 out of 5.  Mild decreased fluency with right upper extremity coordination compared to left.  Psychiatric:        Behavior: Behavior normal.    ED Results / Procedures / Treatments   Labs (all labs ordered are listed, but only abnormal results are displayed) Labs Reviewed  CBC - Abnormal; Notable for the  following components:      Result Value   RBC 5.57 (*)    MCV 73.8 (*)    MCH 22.3 (*)    RDW 16.4 (*)    All other components within normal limits  COMPREHENSIVE METABOLIC PANEL - Abnormal; Notable for the following components:   Glucose, Bld 106 (*)    All other components within normal limits  I-STAT CHEM 8, ED - Abnormal; Notable for the following components:   Glucose, Bld 106 (*)    All other components within normal limits  ETHANOL  PROTIME-INR  APTT  DIFFERENTIAL  RAPID URINE DRUG SCREEN, HOSP PERFORMED  URINALYSIS, ROUTINE W REFLEX MICROSCOPIC    EKG EKG Interpretation  Date/Time:  Friday February 19 2021 08:57:45 EDT Ventricular Rate:  64 PR Interval:  195 QRS Duration: 87 QT Interval:  515 QTC Calculation: 532 R Axis:   11 Text Interpretation: Sinus rhythm Low voltage, precordial leads Nonspecific T abnrm, anterolateral leads QT calc manually normal No old tracing to compare Confirmed by Deno Etienne [phone]) on 02/19/2021 9:13:29 AM  Radiology CT HEAD WO CONTRAST  Result Date: 02/19/2021 CLINICAL DATA:  Stroke suspected.  Right-sided arm and jaw numbness. EXAM: CT HEAD WITHOUT CONTRAST TECHNIQUE: Contiguous axial images were obtained from the base of the skull through the vertex without intravenous contrast. COMPARISON:  None. FINDINGS: Brain: No evidence of acute infarction, hemorrhage, hydrocephalus, extra-axial collection or mass lesion/mass effect. Prominent basal ganglia calcifications bilaterally.  Vascular: No hyperdense vessel or unexpected calcification. Skull: Normal. Negative for fracture or focal lesion. Sinuses/Orbits: No acute finding. Other: None. IMPRESSION: No acute intracranial abnormality. Electronically Signed   By: Markus Daft M.D.   On: 02/19/2021 09:54   MR BRAIN WO CONTRAST  Result Date: 02/19/2021 CLINICAL DATA:  Acute neuro deficit.  Blurred vision EXAM: MRI HEAD WITHOUT CONTRAST TECHNIQUE: Multiplanar, multiecho pulse sequences of the brain and surrounding structures were obtained without intravenous contrast. COMPARISON:  CT head 02/19/2021 FINDINGS: Brain: No acute infarction, hemorrhage, hydrocephalus, extra-axial collection or mass lesion. Minimal white matter changes with few small deep white matter hyperintensities bilaterally. Brainstem intact. Vascular: Normal arterial flow voids. Skull and upper cervical spine: No focal skeletal abnormality. Sinuses/Orbits: Negative Other: None IMPRESSION: No acute abnormality.  Negative for acute infarct. Electronically Signed   By: Franchot Gallo M.D.   On: 02/19/2021 12:12    Procedures Procedures   Medications Ordered in ED Medications  sodium chloride 0.9 % bolus 1,000 mL (1,000 mLs Intravenous Bolus 02/19/21 0856)    ED Course  I have reviewed the triage vital signs and the nursing notes.  Pertinent labs & imaging results that were available during my care of the patient were reviewed by me and considered in my medical decision making (see chart for details).    MDM Rules/Calculators/A&P                           70 yo F with a chief complaints of waking up with right arm weakness change in her right visual field.  She is Lucianne Lei negative.  Will obtain a CT of the head.  MRI.  Blood work.  Reassess.  MRI is negative.  Blood work is largely unremarkable.  I discussed the results with the patient.  I offered to discuss with neurology for possible TIA observation.  Patient is declining and understands the risk that that it  could result in a major stroke within the month.  She  plans on following up with her family doctor.  Given a referral to neurology.  12:35 PM:  I have discussed the diagnosis/risks/treatment options with the patient and believe the pt to be eligible for discharge home to follow-up with PCP. We also discussed returning to the ED immediately if new or worsening sx occur. We discussed the sx which are most concerning (e.g., sudden worsening pain, fever, inability to tolerate by mouth) that necessitate immediate return. Medications administered to the patient during their visit and any new prescriptions provided to the patient are listed below.  Medications given during this visit Medications  sodium chloride 0.9 % bolus 1,000 mL (1,000 mLs Intravenous Bolus 02/19/21 0856)     The patient appears reasonably screen and/or stabilized for discharge and I doubt any other medical condition or other [facility] requiring further screening, evaluation, or treatment in the ED at this time prior to discharge.   Final Clinical Impression(s) / ED Diagnoses Final diagnoses:  Paresthesia    Rx / DC Orders ED Discharge Orders          Ordered    Ambulatory referral to Neurology       Comments: TIA?  Paresthesia?   02/19/21 Carlin, [place], DO 02/19/21 1235

## 2021-02-19 NOTE — ED Triage Notes (Addendum)
Pt reports right sided arm and jaw numbness with right sided blurred vision that she noticed upon waking around 530am this morning. Pt now endorses right arm pain and right sided blurry vision. Denies weakness and confusion. A&O x4.

## 2021-02-19 NOTE — ED Notes (Signed)
Pt is in CT SCAN

## 2021-02-19 NOTE — ED Notes (Addendum)
Secretary notified by this RN to page tele-neurology

## 2021-02-19 NOTE — Discharge Instructions (Addendum)
Please follow-up with your family doctor and neurologist.  Please return for worsening symptoms.  Please return if you decide that she wants to possibly be admitted overnight for further work-up.

## 2021-02-22 ENCOUNTER — Encounter: Payer: Self-pay | Admitting: Neurology

## 2021-03-18 ENCOUNTER — Ambulatory Visit: Payer: Medicare Other | Admitting: Neurology

## 2022-01-24 ENCOUNTER — Other Ambulatory Visit (HOSPITAL_COMMUNITY): Payer: Self-pay | Admitting: Family Medicine

## 2022-01-24 DIAGNOSIS — M79662 Pain in left lower leg: Secondary | ICD-10-CM

## 2022-01-24 DIAGNOSIS — M79661 Pain in right lower leg: Secondary | ICD-10-CM

## 2022-01-26 ENCOUNTER — Ambulatory Visit (HOSPITAL_COMMUNITY)
Admission: RE | Admit: 2022-01-26 | Discharge: 2022-01-26 | Disposition: A | Discharge status: Home / Self Care | Payer: Medicare Other | Source: Ambulatory Visit | Attending: Vascular Surgery | Admitting: Vascular Surgery

## 2022-01-26 DIAGNOSIS — M79662 Pain in left lower leg: Secondary | ICD-10-CM | POA: Diagnosis present

## 2022-01-26 DIAGNOSIS — M79661 Pain in right lower leg: Secondary | ICD-10-CM | POA: Insufficient documentation

## 2022-01-31 ENCOUNTER — Emergency Department (HOSPITAL_COMMUNITY): Payer: Medicare Other

## 2022-01-31 ENCOUNTER — Emergency Department (HOSPITAL_COMMUNITY)
Admission: EM | Admit: 2022-01-31 | Discharge: 2022-02-01 | Disposition: A | Discharge status: Home / Self Care | Payer: Medicare Other | Attending: Emergency Medicine | Admitting: Emergency Medicine

## 2022-01-31 ENCOUNTER — Encounter (HOSPITAL_COMMUNITY): Payer: Self-pay | Admitting: Emergency Medicine

## 2022-01-31 DIAGNOSIS — J45909 Unspecified asthma, uncomplicated: Secondary | ICD-10-CM | POA: Insufficient documentation

## 2022-01-31 DIAGNOSIS — W01198A Fall on same level from slipping, tripping and stumbling with subsequent striking against other object, initial encounter: Secondary | ICD-10-CM | POA: Insufficient documentation

## 2022-01-31 DIAGNOSIS — M546 Pain in thoracic spine: Secondary | ICD-10-CM | POA: Diagnosis not present

## 2022-01-31 DIAGNOSIS — Z79899 Other long term (current) drug therapy: Secondary | ICD-10-CM | POA: Insufficient documentation

## 2022-01-31 DIAGNOSIS — S82142A Displaced bicondylar fracture of left tibia, initial encounter for closed fracture: Secondary | ICD-10-CM

## 2022-01-31 DIAGNOSIS — S0990XA Unspecified injury of head, initial encounter: Secondary | ICD-10-CM | POA: Diagnosis not present

## 2022-01-31 DIAGNOSIS — I10 Essential (primary) hypertension: Secondary | ICD-10-CM | POA: Insufficient documentation

## 2022-01-31 DIAGNOSIS — S8991XA Unspecified injury of right lower leg, initial encounter: Secondary | ICD-10-CM | POA: Diagnosis present

## 2022-01-31 DIAGNOSIS — M25561 Pain in right knee: Secondary | ICD-10-CM | POA: Insufficient documentation

## 2022-01-31 DIAGNOSIS — Y9301 Activity, walking, marching and hiking: Secondary | ICD-10-CM | POA: Diagnosis not present

## 2022-01-31 DIAGNOSIS — M545 Low back pain, unspecified: Secondary | ICD-10-CM | POA: Insufficient documentation

## 2022-01-31 DIAGNOSIS — Z7951 Long term (current) use of inhaled steroids: Secondary | ICD-10-CM | POA: Insufficient documentation

## 2022-01-31 DIAGNOSIS — W19XXXA Unspecified fall, initial encounter: Secondary | ICD-10-CM

## 2022-01-31 DIAGNOSIS — M25562 Pain in left knee: Secondary | ICD-10-CM

## 2022-01-31 LAB — CBC WITH DIFFERENTIAL/PLATELET
Abs Immature Granulocytes: 0.04 10*3/uL (ref 0.00–0.07)
Basophils Absolute: 0 10*3/uL (ref 0.0–0.1)
Basophils Relative: 0 %
Eosinophils Absolute: 0.2 10*3/uL (ref 0.0–0.5)
Eosinophils Relative: 2 %
HCT: 43.9 % (ref 36.0–46.0)
Hemoglobin: 12.9 g/dL (ref 12.0–15.0)
Immature Granulocytes: 1 %
Lymphocytes Relative: 25 %
Lymphs Abs: 2.1 10*3/uL (ref 0.7–4.0)
MCH: 21.9 pg — ABNORMAL LOW (ref 26.0–34.0)
MCHC: 29.4 g/dL — ABNORMAL LOW (ref 30.0–36.0)
MCV: 74.4 fL — ABNORMAL LOW (ref 80.0–100.0)
Monocytes Absolute: 0.6 10*3/uL (ref 0.1–1.0)
Monocytes Relative: 7 %
Neutro Abs: 5.4 10*3/uL (ref 1.7–7.7)
Neutrophils Relative %: 65 %
Platelets: 236 10*3/uL (ref 150–400)
RBC: 5.9 MIL/uL — ABNORMAL HIGH (ref 3.87–5.11)
RDW: 16.9 % — ABNORMAL HIGH (ref 11.5–15.5)
WBC: 8.3 10*3/uL (ref 4.0–10.5)
nRBC: 0 % (ref 0.0–0.2)

## 2022-01-31 LAB — BASIC METABOLIC PANEL
Anion gap: 9 (ref 5–15)
BUN: 10 mg/dL (ref 8–23)
CO2: 26 mmol/L (ref 22–32)
Calcium: 9.1 mg/dL (ref 8.9–10.3)
Chloride: 107 mmol/L (ref 98–111)
Creatinine, Ser: 0.79 mg/dL (ref 0.44–1.00)
GFR, Estimated: >60 mL/min (ref >60)
Glucose, Bld: 103 mg/dL — ABNORMAL HIGH (ref 70–99)
Potassium: 4 mmol/L (ref 3.5–5.1)
Sodium: 142 mmol/L (ref 135–145)

## 2022-01-31 LAB — PROTIME-INR
INR: 1 (ref 0.8–1.2)
Prothrombin Time: 12.8 seconds (ref 11.4–15.2)

## 2022-01-31 MED ORDER — KETOROLAC TROMETHAMINE 30 MG/ML IJ SOLN
30.0000 mg | Freq: Once | INTRAMUSCULAR | Status: AC
  Administered 2022-01-31: 30 mg via INTRAMUSCULAR
  Filled 2022-01-31: qty 1

## 2022-01-31 MED ORDER — HYDROCODONE-ACETAMINOPHEN 5-325 MG PO TABS: 1.0000 | ORAL_TABLET | Freq: Four times a day (QID) | ORAL | 0 refills | Status: DC | PRN

## 2022-01-31 MED ORDER — ACETAMINOPHEN 500 MG PO TABS
1000.0000 mg | ORAL_TABLET | Freq: Once | ORAL | Status: AC
  Administered 2022-01-31: 1000 mg via ORAL
  Filled 2022-01-31: qty 2

## 2022-01-31 NOTE — ED Notes (Signed)
Pt in CT per previous RN

## 2022-01-31 NOTE — ED Notes (Signed)
Pt back from CT

## 2022-01-31 NOTE — ED Triage Notes (Signed)
Patient fell yesterday and hit her head and left leg, states she thinks her leg gave out while walking. Patient complains that she is unable to bear weight on the left leg due to pain in the left ankle and left knee. Patient denies LOC, denies being on blood thinner.

## 2022-01-31 NOTE — Discharge Instructions (Addendum)
Your CT imaging was negative for acute fracture.  No acute intracranial abnormality.  Your x-ray imaging was also negative.  You have been placed in a knee mobilizer due to significant pain and swelling of the left knee.  Recommend ambulation with the assistance of a walker.  Follow-up with sports medicine in 1 week for repeat assessment.  He may benefit from MRI imaging of the knee to further evaluate your ACL, MCL and LCL after your fall. Follow-up with orthopedics for potential tibial plateau fracture. Tylenol and Motrin for pain control in addition to a short course of opiates.

## 2022-01-31 NOTE — ED Provider Triage Note (Signed)
Emergency Medicine Provider Triage Evaluation Note  Madeline Miles , a 71 y.o. female  was evaluated in triage.  Pt complains of left-sided knee and ankle pain after a fall that she experienced on Sunday night while [place].  Patient states she tripped and fell landing directly on her left knee, also hitting the top of her head.  She denies using blood thinners, denies loss of consciousness.  Patient states that this morning upon waking she had a hard time bearing weight on the left side with pain radiating from the left knee to the left ankle  Review of Systems  Positive: Left ankle and left knee pain Negative: Loss of Consciousness  Physical Exam  BP (!) 179/86   Pulse 62   Temp 98.5 F (36.9 C) (Oral)   Resp 16   SpO2 97%  Gen:   Awake, no distress   Resp:  Normal effort  MSK:   Moves extremities without difficulty  Other:   Medical Decision Making  Medically screening exam initiated at 1:59 PM.  Appropriate orders placed.  Madeline Miles was informed that the remainder of the evaluation will be completed by another provider, this initial triage assessment does not replace that evaluation, and the importance of remaining in the ED until their evaluation is complete.     Dorothyann Peng, PA-C 01/31/22 1400

## 2022-01-31 NOTE — ED Provider Notes (Signed)
[facility] EMERGENCY DEPARTMENT Provider Note   CSN: [id number] Arrival date & time: 01/31/22  1314     History  Chief Complaint  Patient presents with   Lytle Michaels    Madeline Miles is a 71 y.o. female.   Fall    71 year old female with medical history significant for asthma, hypertension who presents to the emergency department with left-sided knee and ankle pain after a fall on Sunday night.  She was walking [place] bowling [place] and tripped and fell landing on her left knee and striking the top of her head against a brick wall.  She is not on anticoagulation and has loss of consciousness.  This morning upon waking she had significant difficulty bearing weight on the left knee and left lower extremity.  She has pain radiating from her left knee to her left ankle.  She endorses swelling.  She states that she twisted her leg on the way down.  She arrived to the emergency department GCS 15, ABC intact.  Home Medications Prior to Admission medications   Medication Sig Start Date End Date Taking? Authorizing Provider  acetaminophen (TYLENOL) 500 MG tablet Take 1,000 mg by mouth every 6 (six) hours as needed for mild pain or headache.    [provider]  albuterol (PROVENTIL) (2.5 MG/3ML) 0.083% nebulizer solution Take 2.5 mg by nebulization every 4 (four) hours as needed for wheezing or shortness of breath. 12/18/12   [provider]  albuterol (VENTOLIN HFA) 108 (90 Base) MCG/ACT inhaler Inhale 2 puffs into the lungs every 4 (four) hours as needed for wheezing or shortness of breath.    [provider]  amLODipine (NORVASC) 10 MG tablet Take 10 mg by mouth daily.    [provider]  Calcium Carbonate-Vit D-Min (CALTRATE 600+D PLUS PO) Take 1 tablet by mouth daily.    [provider]  Cholecalciferol 50 MCG (2000 UT) CAPS Take 1 capsule by mouth daily.    [provider]  ELDERBERRY PO Take 1 tablet by mouth daily.     [provider]  fexofenadine (ALLEGRA) 180 MG tablet Take 180 mg by mouth daily as needed for allergies or rhinitis.    [provider]  fluticasone (FLONASE) 50 MCG/ACT nasal spray Place 2 sprays into both nostrils at bedtime.    [provider]  IRON PO Take 1 capsule by mouth daily.    [provider]  Multiple Vitamins-Minerals (EMERGEN-C IMMUNE PO) Take 1 tablet by mouth daily.    [provider]  multivitamin-iron-minerals-folic acid (CENTRUM) chewable tablet Chew 1 tablet by mouth daily.    [provider]  Propylene Glycol (SYSTANE BALANCE OP) Place 1 drop into both eyes daily.    [provider]  Turmeric (QC TUMERIC COMPLEX PO) Take 1 tablet by mouth daily.    [provider]      Allergies    Pseudoephedrine hcl    Review of Systems   Review of Systems  All other systems reviewed and are negative.   Physical Exam Updated Vital Signs BP 120/77   Pulse (!) 57   Temp 97.9 F (36.6 C) (Oral)   Resp 14   SpO2 98%  Physical Exam Vitals and nursing note reviewed.  Constitutional:      General: She is not in acute distress.    Appearance: She is well-developed.     Comments: GCS 15, ABC intact  HENT:     Head: Normocephalic and atraumatic.  Eyes:     Extraocular Movements: Extraocular movements intact.     Conjunctiva/sclera: Conjunctivae normal.     Pupils: Pupils are equal, round, and reactive to light.  Neck:     Comments: No midline tenderness to palpation of the cervical spine.  Range of motion intact Cardiovascular:     Rate and Rhythm: Normal rate and regular rhythm.  Pulmonary:     Effort: Pulmonary effort is normal. No respiratory distress.     Breath sounds: Normal breath sounds.  Chest:     Comments: Clavicles stable nontender to AP compression.  Chest wall stable and nontender to AP and lateral compression. Abdominal:     Palpations: Abdomen is soft.     Tenderness: There is no  abdominal tenderness.  Musculoskeletal:     Cervical back: Neck supple.     Comments: Mild midline tenderness to palpation of the thoracic and lumbar spine.  Extremities atraumatic with intact range of motion with the exception of tenderness to palpation of the knee bilaterally about the tibial plateau, swelling of the left lower extremity, no tenderness of the medial or lateral malleolus, 2+ DP pulses  to the left lower extremity  Skin:    General: Skin is warm and dry.  Neurological:     Mental Status: She is alert.     Comments: Cranial nerves II through XII grossly intact.  Moving all 4 extremities spontaneously.  Sensation grossly intact all 4 extremities     ED Results / Procedures / Treatments   Labs (all labs ordered are listed, but only abnormal results are displayed) Labs Reviewed  CBC WITH DIFFERENTIAL/PLATELET - Abnormal; Notable for the following components:      Result Value   RBC 5.90 (*)    MCV 74.4 (*)    MCH 21.9 (*)    MCHC 29.4 (*)    RDW 16.9 (*)    All other components within normal limits  BASIC METABOLIC PANEL - Abnormal; Notable for the following components:   Glucose, Bld 103 (*)    All other components within normal limits  PROTIME-INR    EKG EKG Interpretation  Date/Time:  Monday January 31 2022 21:28:57 EDT Ventricular Rate:  57 PR Interval:  188 QRS Duration: 107 QT Interval:  389 QTC Calculation: 379 R Axis:   -2 Text Interpretation: Sinus rhythm Nonspecific T abnrm, anterolateral leads Confirmed by Regan Lemming ([id number]) on 01/31/2022 10:43:01 PM  Radiology CT Thoracic Spine Wo Contrast  Result Date: 01/31/2022 CLINICAL DATA:  Trauma EXAM: CT THORACIC AND LUMBAR SPINE WITHOUT CONTRAST TECHNIQUE: Multidetector CT imaging of the thoracic and lumbar spine was performed without contrast. Multiplanar CT image reconstructions were also generated. RADIATION DOSE REDUCTION: This exam was performed according to the departmental dose-optimization program  which includes automated exposure control, adjustment of the mA and/or kV according to patient size and/or use of iterative reconstruction technique. COMPARISON:  None Available. FINDINGS: CT THORACIC SPINE FINDINGS Alignment: Normal. Vertebrae: No acute fracture or focal pathologic process. Paraspinal and other soft tissues: Negative. Disc levels: No spinal canal stenosis. CT LUMBAR SPINE FINDINGS Segmentation: 5 lumbar type vertebrae. Alignment: Normal. Vertebrae: No acute fracture or focal pathologic process. Paraspinal and other soft tissues: Negative. Disc levels: No spinal canal stenosis IMPRESSION: No acute fracture or traumatic subluxation of the thoracic or lumbar spine. Electronically Signed   By: Ulyses Jarred M.D.   On: 01/31/2022 23:37   CT Lumbar Spine Wo Contrast  Result Date: 01/31/2022 CLINICAL DATA:  Trauma EXAM:  CT THORACIC AND LUMBAR SPINE WITHOUT CONTRAST TECHNIQUE: Multidetector CT imaging of the thoracic and lumbar spine was performed without contrast. Multiplanar CT image reconstructions were also generated. RADIATION DOSE REDUCTION: This exam was performed according to the departmental dose-optimization program which includes automated exposure control, adjustment of the mA and/or kV according to patient size and/or use of iterative reconstruction technique. COMPARISON:  None Available. FINDINGS: CT THORACIC SPINE FINDINGS Alignment: Normal. Vertebrae: No acute fracture or focal pathologic process. Paraspinal and other soft tissues: Negative. Disc levels: No spinal canal stenosis. CT LUMBAR SPINE FINDINGS Segmentation: 5 lumbar type vertebrae. Alignment: Normal. Vertebrae: No acute fracture or focal pathologic process. Paraspinal and other soft tissues: Negative. Disc levels: No spinal canal stenosis IMPRESSION: No acute fracture or traumatic subluxation of the thoracic or lumbar spine. Electronically Signed   By: Ulyses Jarred M.D.   On: 01/31/2022 23:37   CT HEAD WO CONTRAST  (5MM)  Result Date: 01/31/2022 CLINICAL DATA:  Head trauma, minor (Age >= 65y); Neck trauma (Age >= 65y). Fall EXAM: CT HEAD WITHOUT CONTRAST CT CERVICAL SPINE WITHOUT CONTRAST TECHNIQUE: Multidetector CT imaging of the head and cervical spine was performed following the standard protocol without intravenous contrast. Multiplanar CT image reconstructions of the cervical spine were also generated. RADIATION DOSE REDUCTION: This exam was performed according to the departmental dose-optimization program which includes automated exposure control, adjustment of the mA and/or kV according to patient size and/or use of iterative reconstruction technique. COMPARISON:  None Available. FINDINGS: CT HEAD FINDINGS Brain: Normal anatomic configuration. Prominent senescent calcifications within the globus palate I bilaterally again noted. No abnormal intra or extra-axial mass lesion or fluid collection. No abnormal mass effect or midline shift. No evidence of acute intracranial hemorrhage or infarct. Ventricular size is normal. Cerebellum unremarkable. Vascular: Unremarkable Skull: Intact Sinuses/Orbits: Paranasal sinuses are clear. Orbits are unremarkable. Other: Mastoid air cells and middle ear cavities are clear. CT CERVICAL SPINE FINDINGS Alignment: Normal.  No listhesis Skull base and vertebrae: Craniocervical alignment is normal. Atlantodental interval is not widened. No acute fracture of the cervical spine. Vertebral body height is preserved. Soft tissues and spinal canal: No prevertebral fluid or swelling. No visible canal hematoma. Disc levels: Intervertebral disc heights are preserved. Mild endplate remodeling of V7-Q4 is present in keeping changes of mild degenerative disc disease. Prevertebral soft tissues are not thickened on sagittal reformats. Spinal canal is widely patent. No significant neuroforaminal narrowing. Upper chest: Negative. Other: None IMPRESSION: 1. No acute intracranial abnormality. No calvarial  fracture. 2. No acute fracture or listhesis of the cervical spine. Electronically Signed   By: Fidela Salisbury M.D.   On: 01/31/2022 23:26   CT Cervical Spine Wo Contrast  Result Date: 01/31/2022 CLINICAL DATA:  Head trauma, minor (Age >= 65y); Neck trauma (Age >= 65y). Fall EXAM: CT HEAD WITHOUT CONTRAST CT CERVICAL SPINE WITHOUT CONTRAST TECHNIQUE: Multidetector CT imaging of the head and cervical spine was performed following the standard protocol without intravenous contrast. Multiplanar CT image reconstructions of the cervical spine were also generated. RADIATION DOSE REDUCTION: This exam was performed according to the departmental dose-optimization program which includes automated exposure control, adjustment of the mA and/or kV according to patient size and/or use of iterative reconstruction technique. COMPARISON:  None Available. FINDINGS: CT HEAD FINDINGS Brain: Normal anatomic configuration. Prominent senescent calcifications within the globus palate I bilaterally again noted. No abnormal intra or extra-axial mass lesion or fluid collection. No abnormal mass effect or midline shift. No evidence of acute intracranial  hemorrhage or infarct. Ventricular size is normal. Cerebellum unremarkable. Vascular: Unremarkable Skull: Intact Sinuses/Orbits: Paranasal sinuses are clear. Orbits are unremarkable. Other: Mastoid air cells and middle ear cavities are clear. CT CERVICAL SPINE FINDINGS Alignment: Normal.  No listhesis Skull base and vertebrae: Craniocervical alignment is normal. Atlantodental interval is not widened. No acute fracture of the cervical spine. Vertebral body height is preserved. Soft tissues and spinal canal: No prevertebral fluid or swelling. No visible canal hematoma. Disc levels: Intervertebral disc heights are preserved. Mild endplate remodeling of J1-P9 is present in keeping changes of mild degenerative disc disease. Prevertebral soft tissues are not thickened on sagittal reformats. Spinal  canal is widely patent. No significant neuroforaminal narrowing. Upper chest: Negative. Other: None IMPRESSION: 1. No acute intracranial abnormality. No calvarial fracture. 2. No acute fracture or listhesis of the cervical spine. Electronically Signed   By: Fidela Salisbury M.D.   On: 01/31/2022 23:26   DG Tibia/Fibula Left  Result Date: 01/31/2022 CLINICAL DATA:  [place], left leg pain EXAM: LEFT TIBIA AND FIBULA - 2 VIEW COMPARISON:  None Available. FINDINGS: Frontal and lateral views of the left tibia and fibula are obtained. No acute displaced fracture. Postsurgical changes from prior left ACL repair. Moderate left knee osteoarthritis. Left ankle is unremarkable. The soft tissues are normal. IMPRESSION: 1. Postsurgical and degenerative changes of the left knee. 2. No acute bony abnormality. Electronically Signed   By: Randa Ngo M.D.   On: 01/31/2022 21:06   DG Shoulder Left  Result Date: 01/31/2022 CLINICAL DATA:  [place], left shoulder pain EXAM: LEFT SHOULDER - 2+ VIEW COMPARISON:  None Available. FINDINGS: Internal rotation, external rotation, and transscapular views of the left shoulder are obtained. Surgical anchor is identified within the humeral head. No fracture, subluxation, or dislocation. Mild glenohumeral and acromioclavicular joint osteoarthritis. Soft tissues are unremarkable. Left chest is clear. IMPRESSION: 1. Mild osteoarthritis.  No acute fracture. Electronically Signed   By: Randa Ngo M.D.   On: 01/31/2022 21:05   DG Ankle Complete Left  Result Date: 01/31/2022 CLINICAL DATA:  [place] yesterday onset of bowling alley when lifting grandchild. Lateral left ankle pain and swelling. EXAM: LEFT ANKLE COMPLETE - 3+ VIEW COMPARISON:  Left great toe radiographs 07/12/2016 FINDINGS: Tiny plantar calcaneal heel spur. The ankle mortise is symmetric and intact. Minimal dorsal navicular-cuneiform degenerative osteophytes. Moderate chronic enthesopathic changes at the Achilles insertion on the  calcaneus appears similar to 07/12/2016 lateral view of the foot. No acute fracture or dislocation. IMPRESSION: 1. Small plantar calcaneal heel spur and moderate chronic enthesopathic change at the Achilles insertion on the calcaneus. 2. No acute fracture. Electronically Signed   By: Yvonne Kendall M.D.   On: 01/31/2022 14:47   DG Knee Complete 4 Views Left  Result Date: 01/31/2022 CLINICAL DATA:  Left knee pain after fall yesterday. EXAM: LEFT KNEE - COMPLETE 4+ VIEW COMPARISON:  None Available. FINDINGS: No evidence of fracture, dislocation, or joint effusion. Status post ACL reconstruction. Moderate degenerative changes seen involving the lateral and patellofemoral joint spaces. Soft tissues are unremarkable. IMPRESSION: Moderate degenerative and postsurgical changes as described above. No acute abnormality seen. Electronically Signed   By: Marijo Conception M.D.   On: 01/31/2022 14:47    Procedures Procedures    Medications Ordered in ED Medications  ketorolac (TORADOL) 30 MG/ML injection 30 mg (has no administration in time range)  acetaminophen (TYLENOL) tablet 1,000 mg (1,000 mg Oral Given 01/31/22 2104)    ED Course/ Medical Decision Making/ A&P  Medical Decision Making Amount and/or Complexity of Data Reviewed Labs: ordered. Radiology: ordered.  Risk OTC drugs. Prescription drug management.    71 year old female with medical history significant for asthma, hypertension who presents to the emergency department with left-sided knee and ankle pain after a fall on Sunday night.  She was walking to a bowling alley and tripped and fell landing on her left knee and striking the top of her head against a brick wall.  She is not on anticoagulation and has loss of consciousness.  This morning upon waking she had significant difficulty bearing weight on the left knee and left lower extremity.  She has pain radiating from her left knee to her left ankle.  She endorses  swelling.  She states that she twisted her leg on the way down.  She arrived to the emergency department GCS 15, ABC intact.  On arrival, the patient was vitally stable.  Initial x-ray imaging in triage of the left knee and left ankle was negative for acute fracture or malalignment.  Suspect potential tibial plateau fracture based on the pain and swelling and inability to bear weight.  Additionally considered DVT, ligamentous injury of the knee.  Tendon injury considered.  Given patient's head trauma against a brick wall, age, CT imaging of the head and cervical spine was performed.  CT of the left knee was also ordered.  Patient had midline tenderness palpation of the thoracic and lumbar spine therefore CT imaging was ordered.  Trauma imaging revealed (full reports in EMR): CT head: No acute intracranial abnormality CT cervical spine: No fracture or malalignment of the cervical spine CT thoracic spine: No acute fracture or subluxation of the thoracic spine CT lumbar spine: No acute fracture or subluxation of the lumbar spine CT left knee: *** X-ray left shoulder: Negative X-ray left knee: Negative FINDINGS:  No evidence of fracture, dislocation, or joint effusion. Status post  ACL reconstruction. Moderate degenerative changes seen involving the  lateral and patellofemoral joint spaces. Soft tissues are  unremarkable.    IMPRESSION:  Moderate degenerative and postsurgical changes as described above.  No acute abnormality seen.   X-ray left ankle: Negative IMPRESSION:  1. Small plantar calcaneal heel spur and moderate chronic  enthesopathic change at the Achilles insertion on the calcaneus.  2. No acute fracture.   X-ray tib-fib left: Negative  There were no significant lab abnormalities.  The patient received Tylenol and Toradol while in the ED. the patient was placed in a knee immobilizer on the left.  She was advised to ambulate with the assistance of a walker.   ***  Final  Clinical Impression(s) / ED Diagnoses Final diagnoses:  Fall, initial encounter  Acute pain of left knee    Rx / DC Orders ED Discharge Orders     None

## 2022-02-01 DIAGNOSIS — S82142A Displaced bicondylar fracture of left tibia, initial encounter for closed fracture: Secondary | ICD-10-CM | POA: Diagnosis not present

## 2022-02-01 NOTE — ED Notes (Signed)
Patient verbalizes understanding of d/c instructions. Opportunities for questions and answers were provided. Pt d/c from ED and wheeled to lobby with family.  

## 2022-02-01 NOTE — Progress Notes (Signed)
Orthopedic Tech Progress Note Patient Details:  Madeline Miles 02-09-1951 [id number]  Ortho Devices Type of Ortho Device: Knee Immobilizer Ortho Device/Splint Location: lle Ortho Device/Splint Interventions: Ordered, Application, Adjustment   Post Interventions Patient Tolerated: Well Instructions Provided: Care of device, Adjustment of device  [name] 02/01/2022, 12:14 AM

## 2022-03-22 ENCOUNTER — Encounter: Payer: Self-pay | Admitting: Internal Medicine

## 2022-03-22 ENCOUNTER — Ambulatory Visit: Payer: Medicare Other | Admitting: Internal Medicine

## 2022-03-22 VITALS — BP 126/82 | HR 60 | Temp 97.2°F | Resp 16 | Ht 63.0 in | Wt 188.0 lb

## 2022-03-22 DIAGNOSIS — I10 Essential (primary) hypertension: Secondary | ICD-10-CM

## 2022-03-22 DIAGNOSIS — R002 Palpitations: Secondary | ICD-10-CM

## 2022-03-22 DIAGNOSIS — E782 Mixed hyperlipidemia: Secondary | ICD-10-CM

## 2022-03-22 DIAGNOSIS — R9431 Abnormal electrocardiogram [ECG] [EKG]: Secondary | ICD-10-CM | POA: Insufficient documentation

## 2022-03-22 DIAGNOSIS — I209 Angina pectoris, unspecified: Secondary | ICD-10-CM

## 2022-03-22 NOTE — Progress Notes (Signed)
Primary Physician/Referring:  Renaye Rakers, MD  Patient ID: Madeline Miles, female    DOB: 08-Dec-1950, 71 y.o.   MRN: [id number]  Chief Complaint  Patient presents with  . New Patient (Initial Visit)   HPI:    Madeline Miles  is a 71 y.o. female with HTN, HLD, and abnormal EKG who is here to establish care with cardiology. She has a very strong family history of heart disease. Four of her siblings and her mother had heart attacks and congestive heart failure. One of her sisters had a CABG after only presenting with claudication. Patient has been having anginal symptoms and pain in her calves. Given her own medical history and significant family history, patient would like to be evaluated for heart disease. She is tolerating her BP meds well. She was just started on low dose Crestor. We can increase this if needed depending on her next lipid panel. Patient denies chest pain, shortness of breath, palpitations, diaphoresis, syncope, edema, PND, orthopnea at rest. She is still having pain in her calves when doing things, which was her sister's presenting symptom prior to CABG.   Past Medical History:  Diagnosis Date  . Asthma   . Cancer (HCC)   . Hypertension    Past Surgical History:  Procedure Laterality Date  . BREAST SURGERY    . CESAREAN SECTION    . TUBAL LIGATION     Family History  Problem Relation Age of Onset  . Heart disease Mother   . Hyperlipidemia Mother   . Hypertension Mother   . Hypertension Father   . Stroke Father   . Diabetes Sister   . Hypertension Sister   . Hyperlipidemia Sister   . Heart disease Brother   . Heart disease Brother   . Hypertension Brother   . Heart disease Sister   . Hypertension Sister     Social History   Tobacco Use  . Smoking status: Never  . Smokeless tobacco: Never  Substance Use Topics  . Alcohol use: No   Marital Status: Widowed  ROS  Review of Systems  Cardiovascular:  Positive for chest pain and claudication.  Negative for leg swelling, paroxysmal nocturnal dyspnea and syncope.  Objective  Blood pressure 126/82, pulse 60, temperature (!) 97.2 F (36.2 C), temperature source Temporal, resp. rate 16, height 5\' 3"  (1.6 m), weight 188 lb (85.3 kg), SpO2 94 %. Body mass index is 33.3 kg/m.     03/22/2022    8:59 AM 02/01/2022   12:00 AM 01/31/2022   11:30 PM  Vitals with BMI  Height 5\' 3"     Weight 188 lbs    BMI 33.31    Systolic 126 148 04/02/2022  Diastolic 82 77 63  Pulse 60 63 60     Physical Exam Vitals and nursing note reviewed.  Constitutional:      General: She is not in acute distress. HENT:     Head: Normocephalic and atraumatic.  Cardiovascular:     Rate and Rhythm: Regular rhythm. Bradycardia present.     Pulses: Normal pulses.     Heart sounds: Normal heart sounds. No murmur heard. Pulmonary:     Effort: Pulmonary effort is normal.     Breath sounds: Normal breath sounds.  Abdominal:     General: Bowel sounds are normal.  Musculoskeletal:     Right lower leg: No edema.     Left lower leg: No edema.  Skin:    General: Skin is warm and dry.  Neurological:     Mental Status: She is alert.   Medications and allergies   Allergies  Allergen Reactions  . Pseudoephedrine Hcl Other (See Comments)     Medication list after today's encounter   Current Outpatient Medications:  .  acetaminophen (TYLENOL) 500 MG tablet, Take 1,000 mg by mouth every 6 (six) hours as needed for mild pain or headache., Disp: , Rfl:  .  albuterol (PROVENTIL) (2.5 MG/3ML) 0.083% nebulizer solution, Take 2.5 mg by nebulization every 4 (four) hours as needed for wheezing or shortness of breath., Disp: , Rfl:  .  albuterol (VENTOLIN HFA) 108 (90 Base) MCG/ACT inhaler, Inhale 2 puffs into the lungs every 4 (four) hours as needed for wheezing or shortness of breath., Disp: , Rfl:  .  amLODipine (NORVASC) 10 MG tablet, Take 10 mg by mouth daily., Disp: , Rfl:  .  Calcium Carbonate-Vit D-Min (CALTRATE 600+D PLUS  PO), Take 1 tablet by mouth daily., Disp: , Rfl:  .  Cholecalciferol 50 MCG (2000 UT) CAPS, Take 1 capsule by mouth daily., Disp: , Rfl:  .  ELDERBERRY PO, Take 1 tablet by mouth daily., Disp: , Rfl:  .  fexofenadine (ALLEGRA) 180 MG tablet, Take 180 mg by mouth daily as needed for allergies or rhinitis., Disp: , Rfl:  .  fluticasone (FLONASE) 50 MCG/ACT nasal spray, Place 2 sprays into both nostrils at bedtime., Disp: , Rfl:  .  IRON PO, Take 1 capsule by mouth daily., Disp: , Rfl:  .  montelukast (SINGULAIR) 10 MG tablet, PRN, Disp: , Rfl:  .  Multiple Vitamins-Minerals (EMERGEN-C IMMUNE PO), Take 1 tablet by mouth daily., Disp: , Rfl:  .  multivitamin-iron-minerals-folic acid (CENTRUM) chewable tablet, Chew 1 tablet by mouth daily., Disp: , Rfl:  .  Propylene Glycol (SYSTANE BALANCE OP), Place 1 drop into both eyes daily., Disp: , Rfl:  .  rosuvastatin (CRESTOR) 10 MG tablet, Take 10 mg by mouth daily., Disp: , Rfl:  .  triamcinolone cream (KENALOG) 0.1 %, Apply topically 2 (two) times daily. Prn, Disp: , Rfl:  .  Turmeric (QC TUMERIC COMPLEX PO), Take 1 tablet by mouth daily., Disp: , Rfl:   Laboratory examination:   Lab Results  Component Value Date   NA 142 01/31/2022   K 4.0 01/31/2022   CO2 26 01/31/2022   GLUCOSE 103 (H) 01/31/2022   BUN 10 01/31/2022   CREATININE 0.79 01/31/2022   CALCIUM 9.1 01/31/2022   GFRNONAA >60 01/31/2022       Latest Ref Rng & Units 01/31/2022    9:12 PM 02/19/2021    9:16 AM 02/19/2021    9:08 AM  CMP  Glucose 70 - 99 mg/dL 103  106  106   BUN 8 - 23 mg/dL $Remove'10  11  12   'YLxIKpW$ Creatinine 0.44 - 1.00 mg/dL 0.79  0.90  0.94   Sodium 135 - 145 mmol/L 142  142  138   Potassium 3.5 - 5.1 mmol/L 4.0  4.0  3.7   Chloride 98 - 111 mmol/L 107  105  103   CO2 22 - 32 mmol/L 26   26   Calcium 8.9 - 10.3 mg/dL 9.1   9.1   Total Protein 6.5 - 8.1 g/dL   7.5   Total Bilirubin 0.3 - 1.2 mg/dL   0.8   Alkaline Phos 38 - 126 U/L   66   AST 15 - 41 U/L   23   ALT 0  - 44  U/L   18       Latest Ref Rng & Units 01/31/2022    9:12 PM 02/19/2021    9:16 AM 02/19/2021    9:08 AM  CBC  WBC 4.0 - 10.5 K/uL 8.3   5.6   Hemoglobin 12.0 - 15.0 g/dL 12.9  13.6  12.4   Hematocrit 36.0 - 46.0 % 43.9  40.0  41.1   Platelets 150 - 400 K/uL 236   301     Lipid Panel No results for input(s): "CHOL", "TRIG", "[place]", "VLDL", "HDL", "CHOLHDL", "LDLDIRECT" in the last 8760 hours.  HEMOGLOBIN A1C No results found for: "HGBA1C", "MPG" TSH No results for input(s): "TSH" in the last 8760 hours.  External labs:   02/24/22 T cholesterol 230, LDL 144, HDL 71, Tgs 54 hgbA1c 5.9% Cr 1.04, eGFR 57  Radiology:    Cardiac Studies:   No results found for this or any previous visit from the past 1095 days.     No results found for this or any previous visit from the past 1095 days.     EKG:   03/22/22: sinus bradycardia (55 bpm) with T-wave inversions in II, III, aVF, and V3-V6  Assessment     ICD-10-CM   1. Palpitations  R00.2 EKG 12-Lead    PCV ECHOCARDIOGRAM COMPLETE    PCV MYOCARDIAL PERFUSION WO LEXISCAN    2. Essential hypertension  I10 PCV ECHOCARDIOGRAM COMPLETE    PCV MYOCARDIAL PERFUSION WO LEXISCAN    3. Mixed hyperlipidemia  E78.2 PCV ECHOCARDIOGRAM COMPLETE    PCV MYOCARDIAL PERFUSION WO LEXISCAN    4. Angina pectoris (HCC)  I20.9     5. Abnormal electrocardiogram (ECG) (EKG)  R94.31 PCV MYOCARDIAL PERFUSION WO LEXISCAN       Orders Placed This Encounter  Procedures  . PCV MYOCARDIAL PERFUSION WO LEXISCAN    Standing Status:   Future    Standing Expiration Date:   05/22/2022  . EKG 12-Lead  . PCV ECHOCARDIOGRAM COMPLETE    Standing Status:   Future    Standing Expiration Date:   03/23/2023    No orders of the defined types were placed in this encounter.   Medications Discontinued During This Encounter  Medication Reason  . HYDROcodone-acetaminophen (NORCO/VICODIN) 5-325 MG tablet      Recommendations:   Cabella Kimm  is a 71 y.o.  female with HTN, HLD, and abnormal EKG who is here to establish care with cardiology. She has a very strong family history of heart disease. Four of her siblings and her mother had heart attacks and congestive heart failure. One of her sisters had a CABG after only presenting with claudication. Patient has been having anginal symptoms and pain in her calves. Given her own medical history and significant family history, patient would like to be evaluated for heart disease. She is tolerating her BP meds well. She was just started on low dose Crestor. We can increase this if needed depending on her next lipid panel. Patient denies chest pain, shortness of breath, palpitations, diaphoresis, syncope, edema, PND, orthopnea at rest. She is still having pain in her calves when doing things, which was her sister's presenting symptom prior to CABG.  Continue current cardiac medications. Encourage low-sodium diet, less than 2000 mg daily. Schedule imaging tests in office - echo and stress test. Patient has significant family history of heart disease - 4 of her siblings have had MI's and her sister had CABG Follow-up in 2 months or sooner if needed.  Madeline Flock, DO, [facility]  03/22/2022, 9:36 AM Office: [phone] Pager: [phone]

## 2022-03-28 ENCOUNTER — Ambulatory Visit: Payer: Medicare Other

## 2022-03-28 DIAGNOSIS — E782 Mixed hyperlipidemia: Secondary | ICD-10-CM

## 2022-03-28 DIAGNOSIS — R002 Palpitations: Secondary | ICD-10-CM

## 2022-03-28 DIAGNOSIS — I10 Essential (primary) hypertension: Secondary | ICD-10-CM

## 2022-04-11 ENCOUNTER — Ambulatory Visit: Payer: Medicare Other

## 2022-04-11 DIAGNOSIS — I10 Essential (primary) hypertension: Secondary | ICD-10-CM

## 2022-04-11 DIAGNOSIS — R002 Palpitations: Secondary | ICD-10-CM

## 2022-04-11 DIAGNOSIS — R9431 Abnormal electrocardiogram [ECG] [EKG]: Secondary | ICD-10-CM

## 2022-04-11 DIAGNOSIS — E782 Mixed hyperlipidemia: Secondary | ICD-10-CM

## 2022-05-30 ENCOUNTER — Encounter: Payer: Self-pay | Admitting: Internal Medicine

## 2022-05-30 ENCOUNTER — Ambulatory Visit: Payer: Medicare Other | Admitting: Internal Medicine

## 2022-05-30 VITALS — BP 152/96 | HR 65 | Ht 63.0 in | Wt 183.0 lb

## 2022-05-30 DIAGNOSIS — E782 Mixed hyperlipidemia: Secondary | ICD-10-CM

## 2022-05-30 DIAGNOSIS — I10 Essential (primary) hypertension: Secondary | ICD-10-CM

## 2022-05-30 MED ORDER — LOSARTAN POTASSIUM 25 MG PO TABS: 25.0000 mg | ORAL_TABLET | Freq: Every day | ORAL | 3 refills | Status: DC

## 2022-05-30 NOTE — Progress Notes (Signed)
Primary Physician/Referring:  Lucianne Lei, MD  Patient ID: Madeline Miles, female    DOB: 1951-06-11, 71 y.o.   MRN: [id number]  Chief Complaint  Patient presents with   Palpitations   Follow-up   HPI:    Madeline Miles  is a 71 y.o. female with HTN and HLD who is here for a follow-up visit. Her stress test and echocardiogram were normal and patient is very relieved about this. She has been doing well since her last visit. Today her blood pressure is quite elevated but she was rushing to get here and she has not yet taken her amlodipine this morning. Patient monitors her BP and she says it is always <130/80 when she checks it. She denies chest pain, shortness of breath, palpitations, diaphoresis, syncope, edema, PND, orthopnea.    Past Medical History:  Diagnosis Date   Asthma    Cancer ([place])    Hypertension    Past Surgical History:  Procedure Laterality Date   BREAST SURGERY     CESAREAN SECTION     TUBAL LIGATION     Family History  Problem Relation Age of Onset   Heart disease Mother    Hyperlipidemia Mother    Hypertension Mother    Hypertension Father    Stroke Father    Diabetes Sister    Hypertension Sister    Hyperlipidemia Sister    Heart disease Brother    Heart disease Brother    Hypertension Brother    Heart disease Sister    Hypertension Sister     Social History   Tobacco Use   Smoking status: Never   Smokeless tobacco: Never  Substance Use Topics   Alcohol use: No   Marital Status: Widowed  ROS  Review of Systems  Cardiovascular:  Negative for chest pain, claudication, leg swelling, paroxysmal nocturnal dyspnea and syncope.   Objective  Blood pressure (!) 152/96, pulse 65, height _0  (1.6 m), weight 183 lb (83 kg), SpO2 97 %. Body mass index is 32.42 kg/m.     05/30/2022    8:55 AM 05/30/2022    8:49 AM 03/22/2022    8:59 AM  Vitals with BMI  Height  _1  _2   Weight  183 lbs 188 lbs  BMI  83.66 29.47  Systolic 654 650 354   Diastolic 96 97 82  Pulse 65 70 60     Physical Exam Vitals and nursing note reviewed.  Constitutional:      General: She is not in acute distress. HENT:     Head: Normocephalic and atraumatic.  Cardiovascular:     Rate and Rhythm: Regular rhythm. Bradycardia present.     Pulses: Normal pulses.     Heart sounds: Normal heart sounds. No murmur heard. Pulmonary:     Effort: Pulmonary effort is normal.     Breath sounds: Normal breath sounds.  Abdominal:     General: Bowel sounds are normal.  Musculoskeletal:     Right lower leg: No edema.     Left lower leg: No edema.  Skin:    General: Skin is warm and dry.  Neurological:     Mental Status: She is alert.    Medications and allergies   Allergies  Allergen Reactions   Pseudoephedrine Hcl Other (See Comments)     Medication list after today's encounter   Current Outpatient Medications:    acetaminophen (TYLENOL) 500 MG tablet, Take 1,000 mg by mouth every 6 (six) hours as needed for  mild pain or headache., Disp: , Rfl:    albuterol (PROVENTIL) (2.5 MG/3ML) 0.083% nebulizer solution, Take 2.5 mg by nebulization every 4 (four) hours as needed for wheezing or shortness of breath., Disp: , Rfl:    albuterol (VENTOLIN HFA) 108 (90 Base) MCG/ACT inhaler, Inhale 2 puffs into the lungs every 4 (four) hours as needed for wheezing or shortness of breath., Disp: , Rfl:    amLODipine (NORVASC) 10 MG tablet, Take 10 mg by mouth daily., Disp: , Rfl:    budesonide-formoterol (SYMBICORT) 160-4.5 MCG/ACT inhaler, Inhale 2 puffs into the lungs., Disp: , Rfl:    Calcium Carbonate-Vit D-Min (CALTRATE 600+D PLUS PO), Take 1 tablet by mouth daily., Disp: , Rfl:    Cholecalciferol 50 MCG (2000 UT) CAPS, Take 1 capsule by mouth daily., Disp: , Rfl:    co-enzyme Q-10 30 MG capsule, Take 30 mg by mouth 3 (three) times daily., Disp: , Rfl:    ELDERBERRY PO, Take 1 tablet by mouth daily., Disp: , Rfl:    fexofenadine (ALLEGRA) 180 MG tablet, Take 180  mg by mouth daily as needed for allergies or rhinitis., Disp: , Rfl:    fluticasone (FLONASE) 50 MCG/ACT nasal spray, Place 2 sprays into both nostrils at bedtime., Disp: , Rfl:    IRON PO, Take 1 capsule by mouth daily., Disp: , Rfl:    montelukast (SINGULAIR) 10 MG tablet, PRN, Disp: , Rfl:    Multiple Vitamins-Minerals (EMERGEN-C IMMUNE PO), Take 1 tablet by mouth daily., Disp: , Rfl:    multivitamin-iron-minerals-folic acid (CENTRUM) chewable tablet, Chew 1 tablet by mouth daily., Disp: , Rfl:    Propylene Glycol (SYSTANE BALANCE OP), Place 1 drop into both eyes daily., Disp: , Rfl:    rosuvastatin (CRESTOR) 10 MG tablet, Take 10 mg by mouth daily., Disp: , Rfl:    triamcinolone cream (KENALOG) 0.1 %, Apply topically 2 (two) times daily. Prn, Disp: , Rfl:    Turmeric (QC TUMERIC COMPLEX PO), Take 1 tablet by mouth daily., Disp: , Rfl:   Laboratory examination:   Lab Results  Component Value Date   NA 142 01/31/2022   K 4.0 01/31/2022   CO2 26 01/31/2022   GLUCOSE 103 (H) 01/31/2022   BUN 10 01/31/2022   CREATININE 0.79 01/31/2022   CALCIUM 9.1 01/31/2022   GFRNONAA >60 01/31/2022       Latest Ref Rng & Units 01/31/2022    9:12 PM 02/19/2021    9:16 AM 02/19/2021    9:08 AM  CMP  Glucose 70 - 99 mg/dL 103  106  106   BUN 8 - 23 mg/dL _0 Creatinine 0.44 - 1.00 mg/dL 0.79  0.90  0.94   Sodium 135 - 145 mmol/L 142  142  138   Potassium 3.5 - 5.1 mmol/L 4.0  4.0  3.7   Chloride 98 - 111 mmol/L 107  105  103   CO2 22 - 32 mmol/L 26   26   Calcium 8.9 - 10.3 mg/dL 9.1   9.1   Total Protein 6.5 - 8.1 g/dL   7.5   Total Bilirubin 0.3 - 1.2 mg/dL   0.8   Alkaline Phos 38 - 126 U/L   66   AST 15 - 41 U/L   23   ALT 0 - 44 U/L   18       Latest Ref Rng & Units 01/31/2022    9:12 PM 02/19/2021    9:16  AM 02/19/2021    9:08 AM  CBC  WBC 4.0 - 10.5 K/uL 8.3   5.6   Hemoglobin 12.0 - 15.0 g/dL 12.9  13.6  12.4   Hematocrit 36.0 - 46.0 % 43.9  40.0  41.1   Platelets 150 -  400 K/uL 236   301     Lipid Panel No results for input(s): "CHOL", "TRIG", "LDLCALC", "VLDL", "HDL", "CHOLHDL", "LDLDIRECT" in the last 8760 hours.  HEMOGLOBIN A1C No results found for: "HGBA1C", "MPG" TSH No results for input(s): "TSH" in the last 8760 hours.  External labs:   02/24/22 T cholesterol 230, LDL 144, HDL 71, Tgs 54 hgbA1c 5.9% Cr 1.04, eGFR 57  Radiology:    Cardiac Studies:   Exercise nuclear stress test 04/11/2022: Normal ECG stress. The patient exercised for 5 minutes and 36 seconds of a Bruce protocol, achieving approximately 7.05 METs. & 84% of MPHR.  No chest pain. Normal BP response.  Myocardial perfusion is normal. Overall LV systolic function is normal without regional wall motion abnormalities. Stress LV EF: 67%.  No previous exam available for comparison. Low risk.     Echocardiogram 03/28/2022: Normal LV systolic function with visual EF 60-65%. Left ventricle cavity is normal in size. Normal left ventricular wall thickness. Normal global wall motion. Normal diastolic filling pattern, normal LAP. Mild tricuspid regurgitation. No evidence of pulmonary hypertension. No prior study for comparison.     EKG:   03/22/22: sinus bradycardia (55 bpm) with T-wave inversions in II, III, aVF, and V3-V6  Assessment     ICD-10-CM   1. Mixed hyperlipidemia  E78.2     2. Essential hypertension  I10        No orders of the defined types were placed in this encounter.   Meds ordered this encounter  Medications   DISCONTD: losartan (COZAAR) 25 MG tablet    Sig: Take 1 tablet (25 mg total) by mouth at bedtime.    Dispense:  90 tablet    Refill:  3    Medications Discontinued During This Encounter  Medication Reason   losartan (COZAAR) 25 MG tablet       Recommendations:   Daniele Dillow is a 71 y.o.  female with HTN and HLD  Mixed hyperlipidemia Continue Crestor   Essential hypertension Continue current cardiac medications. Patient  did not yet take amlodipine this morning At home her BP is <130/80 always Encourage low-sodium diet, less than 2000 mg daily.   Follow-up in 6 months or sooner if needed.     Floydene Flock, DO, [facility]  05/30/2022, 9:08 AM Office: [phone] Pager: [phone]

## 2022-08-04 ENCOUNTER — Other Ambulatory Visit: Payer: Self-pay | Admitting: Family Medicine

## 2022-08-04 ENCOUNTER — Ambulatory Visit
Admission: RE | Admit: 2022-08-04 | Discharge: 2022-08-04 | Disposition: A | Discharge status: Home / Self Care | Payer: Medicare Other | Source: Ambulatory Visit | Attending: Family Medicine | Admitting: Family Medicine

## 2022-08-04 DIAGNOSIS — M130 Polyarthritis, unspecified: Secondary | ICD-10-CM

## 2022-09-22 LAB — GLUCOSE, POCT (MANUAL RESULT ENTRY): Glucose Fasting, POC: 87 mg/dL (ref 70–99)

## 2022-09-22 NOTE — Progress Notes (Signed)
No SDOH determined at this time.

## 2022-10-05 ENCOUNTER — Encounter: Payer: Self-pay | Admitting: *Deleted

## 2022-10-05 NOTE — Progress Notes (Signed)
Pt attended 09/22/22 screening event when b/p was 116/90 and blood sugar was 87. Pt confirmed at event that her PCP is Dr. Renaye Rakers, and during call she also noted that she sees Dr. Parke Simmers on a regular basis (chart review cannot confirm since Dr. Tedra Senegal notes are not visibile in [facility].) Pt also stated during f/u call that she does watch her salt intake and that she monitors her own b/p at home: in fact yesterday her b/p at home was 117/72. Pt denied any SDOH needs or insecurities at event and during f/u call. No additional health equity team support indicated at this time.

## 2022-11-29 ENCOUNTER — Ambulatory Visit: Payer: Medicare Other | Admitting: Internal Medicine

## 2023-01-03 ENCOUNTER — Emergency Department (HOSPITAL_BASED_OUTPATIENT_CLINIC_OR_DEPARTMENT_OTHER)
Admission: EM | Admit: 2023-01-03 | Discharge: 2023-01-03 | Disposition: A | Discharge status: Home / Self Care | Payer: Medicare Other | Attending: Emergency Medicine | Admitting: Emergency Medicine

## 2023-01-03 ENCOUNTER — Encounter (HOSPITAL_BASED_OUTPATIENT_CLINIC_OR_DEPARTMENT_OTHER): Payer: Self-pay | Admitting: Emergency Medicine

## 2023-01-03 ENCOUNTER — Other Ambulatory Visit: Payer: Self-pay

## 2023-01-03 DIAGNOSIS — X501XXA Overexertion from prolonged static or awkward postures, initial encounter: Secondary | ICD-10-CM | POA: Diagnosis not present

## 2023-01-03 DIAGNOSIS — Y92810 Car as the place of occurrence of the external cause: Secondary | ICD-10-CM | POA: Insufficient documentation

## 2023-01-03 DIAGNOSIS — M25561 Pain in right knee: Secondary | ICD-10-CM

## 2023-01-03 DIAGNOSIS — M25461 Effusion, right knee: Secondary | ICD-10-CM | POA: Diagnosis not present

## 2023-01-03 MED ORDER — KETOROLAC TROMETHAMINE 15 MG/ML IJ SOLN
15.0000 mg | Freq: Once | INTRAMUSCULAR | Status: AC
  Administered 2023-01-03: 15 mg via INTRAMUSCULAR
  Filled 2023-01-03: qty 1

## 2023-01-03 MED ORDER — OXYCODONE HCL 5 MG PO TABS
5.0000 mg | ORAL_TABLET | Freq: Once | ORAL | Status: AC
  Administered 2023-01-03: 5 mg via ORAL
  Filled 2023-01-03: qty 1

## 2023-01-03 MED ORDER — HYDROCODONE-ACETAMINOPHEN 5-325 MG PO TABS: 1.0000 | ORAL_TABLET | Freq: Four times a day (QID) | ORAL | 0 refills | Status: AC | PRN

## 2023-01-03 NOTE — ED Triage Notes (Signed)
This morning getting in car, heard a crack and felt severe pain in right leg. Knee, shin and ankle. Ambulatory to triage, but unable to bend leg due to pain  Had something similar last month in [place], had steroid injections  around 12/26/22  Has had recent xrays

## 2023-01-03 NOTE — Discharge Instructions (Signed)
Please read and follow all provided instructions.  Your diagnoses today include:  1. Acute pain of right knee     Tests performed today include: Vital signs. See below for your results today.   Medications prescribed:  Vicodin (hydrocodone/acetaminophen) - narcotic pain medication  DO NOT drive or perform any activities that require you to be awake and alert because this medicine can make you drowsy. BE VERY CAREFUL not to take multiple medicines containing Tylenol (also called acetaminophen). Doing so can lead to an overdose which can damage your liver and cause liver failure and possibly death.  Take any prescribed medications only as directed.  Home care instructions:  Follow any educational materials contained in this packet Follow R.I.C.E. Protocol: R - rest your injury  I  - use ice on injury without applying directly to skin C - compress injury with bandage or splint E - elevate the injury as much as possible  Follow-up instructions: Please follow-up with your orthopedic doctor as planned.  Return instructions:  Please return if your toes or feet are numb or tingling, appear gray or blue, or you have severe pain (also elevate the leg and loosen splint or wrap if you were given one) Please return to the Emergency Department if you experience worsening symptoms.  Please return if you have any other emergent concerns.  Additional Information:  Your vital signs today were: BP (!) 177/95 (BP Location: Right Arm)   Pulse 89   Temp 97.7 F (36.5 C) (Oral)   Resp 18   SpO2 100%  If your blood pressure (BP) was elevated above 135/85 this visit, please have this repeated by your doctor within one month. --------------

## 2023-01-03 NOTE — ED Provider Notes (Signed)
[place] EMERGENCY DEPARTMENT AT [facility] Provider Note   CSN: [id number] Arrival date & time: 01/03/23  1830     History  Chief Complaint  Patient presents with   Leg Pain    Madeline Miles is a 72 y.o. female.  Patient presents emergency department today for evaluation of right knee pain.  This has been an intermittently recurring problem for the patient.  Initially she had an injury about 1 month ago.  She was in [place] when this occurred.  She had knee pain that radiated to her left shin and ankle area.  She had associated swelling.  She states that she was evaluated in [place] and had x-rays and an ultrasound to rule out DVT.  She was able to travel back to [place] after receiving some medications there.  She has followed up with her sports medicine doctor here.  She has had repeat x-rays and an injection in her knee.  She does not feel like this helped a whole lot.  Today her pain worsened acutely when she was getting into her car to go to a doctor's appointment.  She states that she was lifting her right leg into the car and had acute worsening of her pain, again radiating into the lower leg.  She has been having difficulty flexing at her knee due to the pain.  Not improved with home medications.  She denies other injuries.       Home Medications Prior to Admission medications   Medication Sig Start Date End Date Taking? Authorizing Provider  acetaminophen (TYLENOL) 500 MG tablet Take 1,000 mg by mouth every 6 (six) hours as needed for mild pain or headache.    [provider]  albuterol (PROVENTIL) (2.5 MG/3ML) 0.083% nebulizer solution Take 2.5 mg by nebulization every 4 (four) hours as needed for wheezing or shortness of breath. 12/18/12   [provider]  albuterol (VENTOLIN HFA) 108 (90 Base) MCG/ACT inhaler Inhale 2 puffs into the lungs every 4 (four) hours as needed for wheezing or shortness of breath.    [provider]   amLODipine (NORVASC) 10 MG tablet Take 10 mg by mouth daily.    [provider]  budesonide-formoterol (SYMBICORT) 160-4.5 MCG/ACT inhaler Inhale 2 puffs into the lungs. 05/11/22   [provider]  Calcium Carbonate-Vit D-Min (CALTRATE 600+D PLUS PO) Take 1 tablet by mouth daily.    [provider]  Cholecalciferol 50 MCG (2000 UT) CAPS Take 1 capsule by mouth daily.    [provider]  co-enzyme Q-10 30 MG capsule Take 30 mg by mouth 3 (three) times daily.    [provider]  ELDERBERRY PO Take 1 tablet by mouth daily.    [provider]  fexofenadine (ALLEGRA) 180 MG tablet Take 180 mg by mouth daily as needed for allergies or rhinitis.    [provider]  fluticasone (FLONASE) 50 MCG/ACT nasal spray Place 2 sprays into both nostrils at bedtime.    [provider]  IRON PO Take 1 capsule by mouth daily.    [provider]  montelukast (SINGULAIR) 10 MG tablet PRN 06/10/21   [provider]  Multiple Vitamins-Minerals (EMERGEN-C IMMUNE PO) Take 1 tablet by mouth daily.    [provider]  multivitamin-iron-minerals-folic acid (CENTRUM) chewable tablet Chew 1 tablet by mouth daily.    [provider]  Propylene Glycol (SYSTANE BALANCE OP) Place 1 drop into both eyes daily.    [provider]  rosuvastatin (CRESTOR) 10 MG tablet Take 10 mg by mouth daily. 03/01/22   [provider]  triamcinolone cream (KENALOG) 0.1 % Apply topically 2 (two) times daily. Prn 11/26/21   [provider]  Turmeric (QC TUMERIC COMPLEX PO) Take 1 tablet by mouth daily.    [provider]      Allergies    Pseudoephedrine hcl    Review of Systems   Review of Systems  Physical Exam Updated Vital Signs BP (!) 177/95 (BP Location: Right Arm)   Pulse 89   Temp 97.7 F (36.5 C) (Oral)   Resp 18   SpO2 100%   Physical Exam Vitals and nursing note reviewed.   Constitutional:      Appearance: She is well-developed.  HENT:     Head: Normocephalic and atraumatic.  Eyes:     Pupils: Pupils are equal, round, and reactive to light.  Cardiovascular:     Pulses: Normal pulses. No decreased pulses.  Musculoskeletal:        General: Tenderness present.     Cervical back: Normal range of motion and neck supple.     Right upper leg: No tenderness.     Right knee: Effusion present. Decreased range of motion. Tenderness present.     Right lower leg: Tenderness present. No swelling.     Right ankle: No tenderness. Normal range of motion.     Right foot: Normal range of motion. No tenderness.  Skin:    General: Skin is warm and dry.  Neurological:     Mental Status: She is alert.     Sensory: No sensory deficit.     Comments: Motor, sensation, and vascular distal to the injury is fully intact.   Psychiatric:        Mood and Affect: Mood normal.     ED Results / Procedures / Treatments   Labs (all labs ordered are listed, but only abnormal results are displayed) Labs Reviewed - No data to display  EKG None  Radiology No results found.  Procedures Procedures    Medications Ordered in ED Medications  ketorolac (TORADOL) 15 MG/ML injection 15 mg (has no administration in time range)  oxyCODONE (Oxy IR/ROXICODONE) immediate release tablet 5 mg (has no administration in time range)    ED Course/ Medical Decision Making/ A&P    Patient seen and examined. History obtained directly from patient.     Labs/EKG: None ordered  Imaging: We discussed repeating x-rays today for the third time, however feel this will be less helpful today.  Medications/Fluids: Ordered: P.o. oxycodone x 1, IM Toradol.    Most recent vital signs reviewed and are as follows: BP (!) 177/95 (BP Location: Right Arm)   Pulse 89   Temp 97.7 F (36.5 C) (Oral)   Resp 18   SpO2 100%   Initial impression: Recurrent right knee pain and injury.  Will plan to place  Ace wrap for compression and stability.  Patient was given the immobilizer during one of her previous visits and this did not do well for her.  She will be able to adjust the Ace wrap somewhat which may be helpful.  Home treatment plan: RICE protocol  Return instructions discussed with patient: New or worsening symptoms  Follow-up instructions discussed with patient: She has an appointment already scheduled with her orthopedist on the morning of the 12th and is encouraged to follow-up.  Medical Decision Making Risk Prescription drug management.   Recurrent right knee pain which has been occurring over the past month or so.  Not improved with injection of steroid into the knee.  It sounds like her her orthopedist may be considering doing an MRI.  It sound like she aggravated this injury today while getting to the car.  Low concern for fracture, dislocation, arterial insufficiency, DVT.        Final Clinical Impression(s) / ED Diagnoses Final diagnoses:  Acute pain of right knee    Rx / DC Orders ED Discharge Orders          Ordered    HYDROcodone-acetaminophen (NORCO/VICODIN) 5-325 MG tablet  Every 6 hours PRN        01/03/23 2101              Renne Crigler, PA-C 01/03/23 2107    Arby Barrette, MD 01/05/23 1320

## 2023-01-09 ENCOUNTER — Ambulatory Visit: Payer: Medicare Other | Admitting: Cardiology

## 2023-06-30 ENCOUNTER — Ambulatory Visit (HOSPITAL_COMMUNITY)
Admission: RE | Admit: 2023-06-30 | Discharge: 2023-06-30 | Disposition: A | Discharge status: Home / Self Care | Payer: Medicare Other | Source: Ambulatory Visit | Attending: Cardiovascular Disease | Admitting: Cardiovascular Disease

## 2023-06-30 ENCOUNTER — Other Ambulatory Visit (HOSPITAL_COMMUNITY): Payer: Self-pay | Admitting: Orthopaedic Surgery

## 2023-06-30 DIAGNOSIS — M25561 Pain in right knee: Secondary | ICD-10-CM | POA: Insufficient documentation
